# Patient Record
Sex: Male | Born: 1950
Health system: Southern US, Community
[De-identification: ages and names within clinical notes are randomized; demographics above are authoritative.]

## PROBLEM LIST (undated history)

## (undated) DIAGNOSIS — I10 Essential (primary) hypertension: Secondary | ICD-10-CM

## (undated) DIAGNOSIS — I2510 Atherosclerotic heart disease of native coronary artery without angina pectoris: Secondary | ICD-10-CM

## (undated) DIAGNOSIS — R001 Bradycardia, unspecified: Secondary | ICD-10-CM

## (undated) DIAGNOSIS — I493 Ventricular premature depolarization: Secondary | ICD-10-CM

---

## 2001-11-29 ENCOUNTER — Emergency Department (HOSPITAL_COMMUNITY): Admission: EM | Admit: 2001-11-29 | Discharge: 2001-11-29 | Payer: Self-pay | Admitting: Emergency Medicine

## 2001-12-10 ENCOUNTER — Emergency Department (HOSPITAL_COMMUNITY): Admission: EM | Admit: 2001-12-10 | Discharge: 2001-12-10 | Payer: Self-pay | Admitting: Emergency Medicine

## 2007-01-14 ENCOUNTER — Encounter: Admission: RE | Admit: 2007-01-14 | Discharge: 2007-01-14 | Payer: Self-pay | Admitting: Emergency Medicine

## 2008-12-21 IMAGING — CT CT ABDOMEN W/ CM
2 of 5 series · 17 of 46 positions shown, 19 images · IV contrast (READICAT/WATER & [ID] OMNI 300)
Comparison: none

CLINICAL DATA: Left lower quadrant pain with fever.  
 ABDOMEN CT WITH CONTRAST:
TECHNIQUE: Multidetector CT imaging of the abdomen was performed following the standard protocol during bolus administration of intravenous contrast.
 Contrast:  100 cc Omnipaque 300
 No comparison.
TECHNIQUE: Multidetector CT imaging of the pelvis was performed following the standard protocol during bolus administration of intravenous contrast.

[Series 2: abdomen w/ · axial · 0.66mm/px · z∈[-384,-29]mm · 14 of 81 slices shown, 16 images]
[im 5/81  soft-tissue]
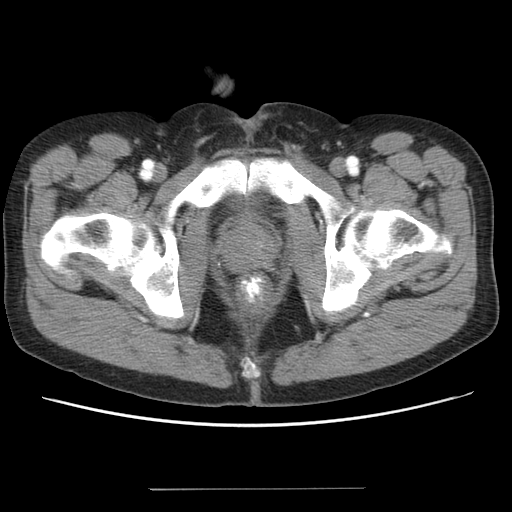
[im 5/81  bone]
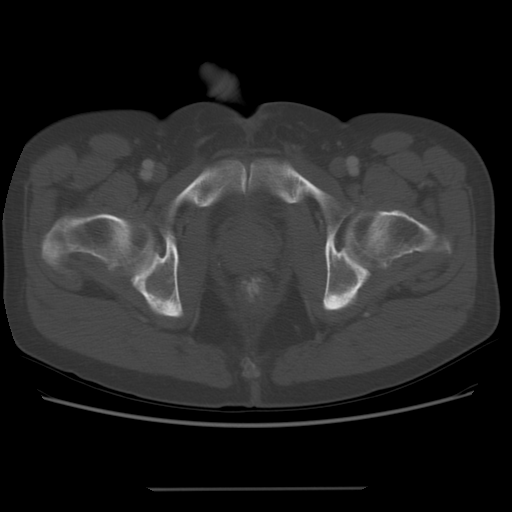
[im 9/81  soft-tissue]
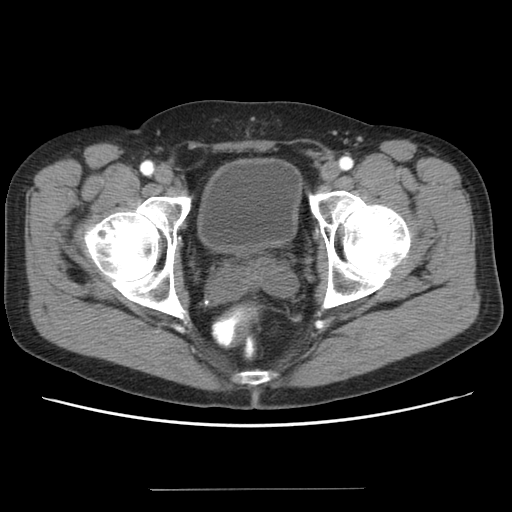
[im 18/81  soft-tissue]
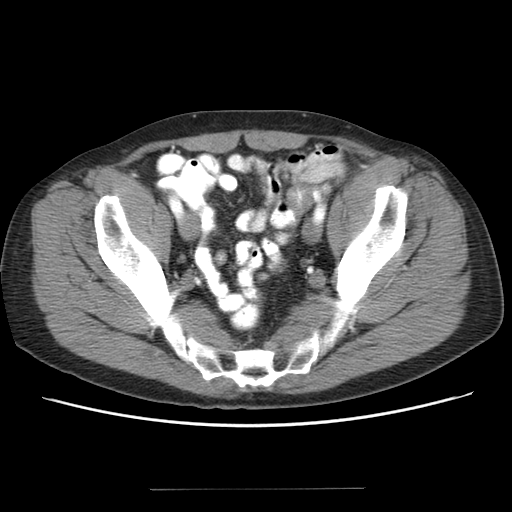
[im 23/81  soft-tissue]
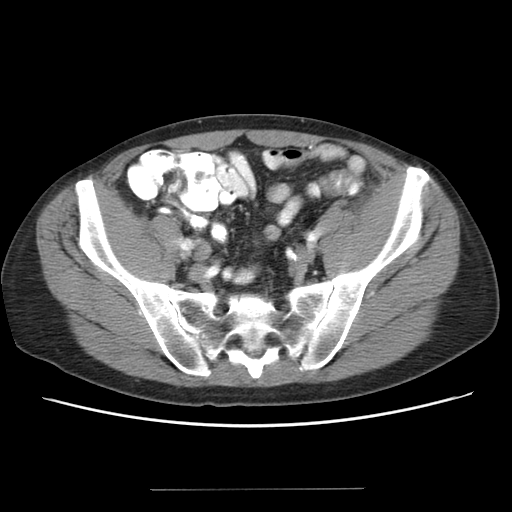
[im 27/81  soft-tissue]
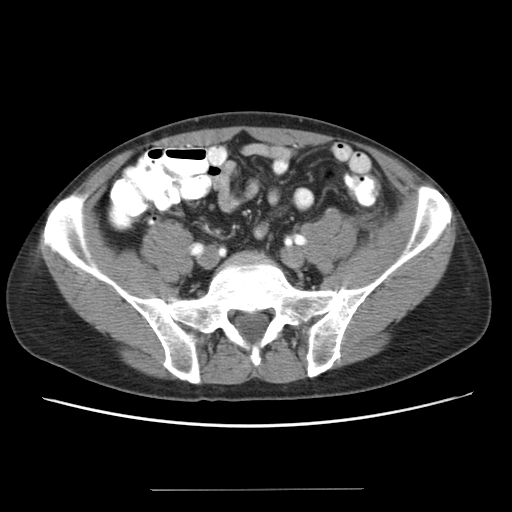
[im 32/81  soft-tissue]
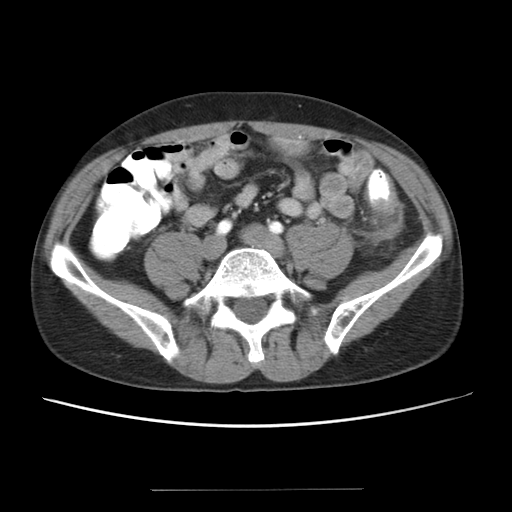
[im 36/81  soft-tissue]
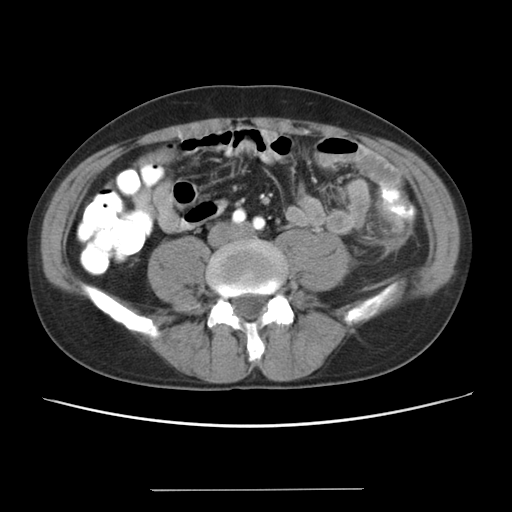
[im 45/81  soft-tissue]
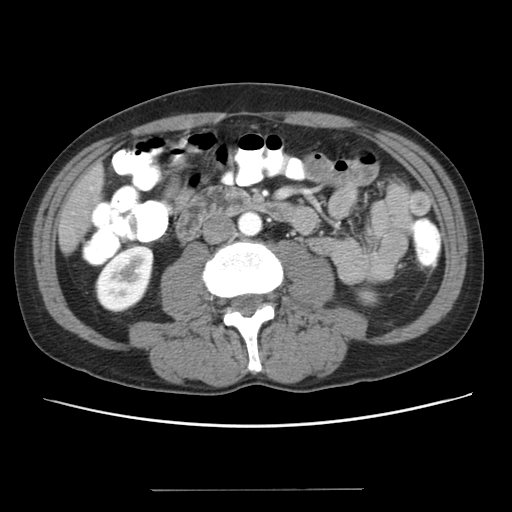
[im 49/81  soft-tissue]
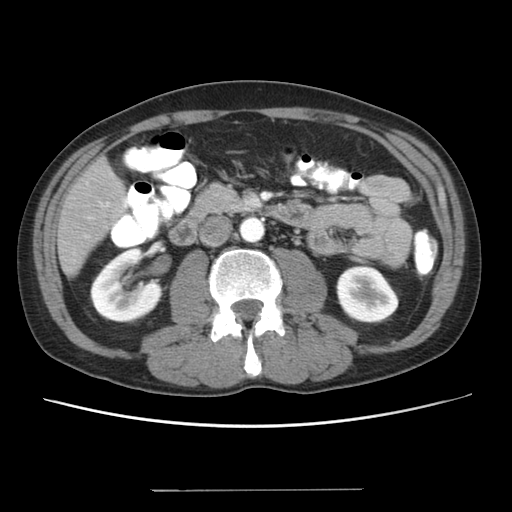
[im 49/81  bone]
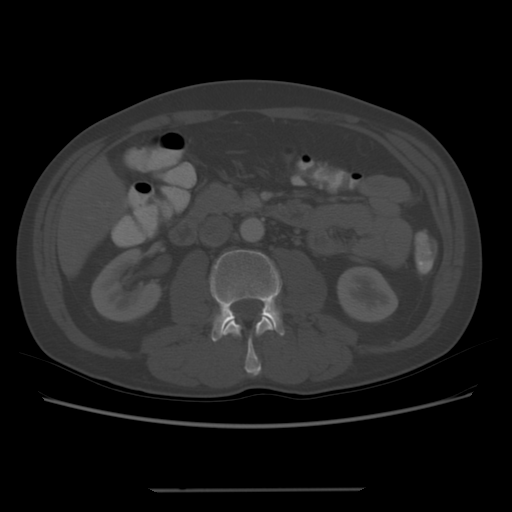
[im 54/81  soft-tissue]
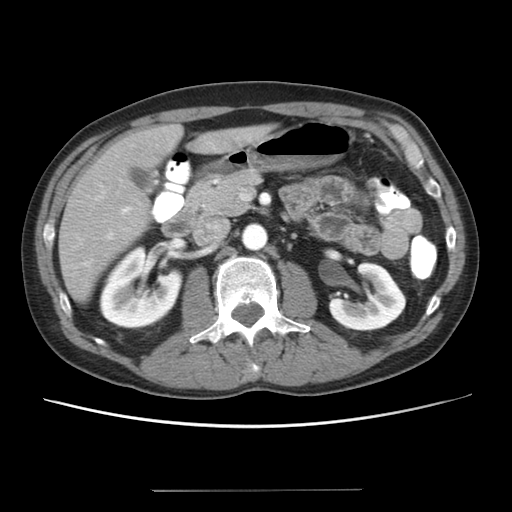
[im 58/81  soft-tissue]
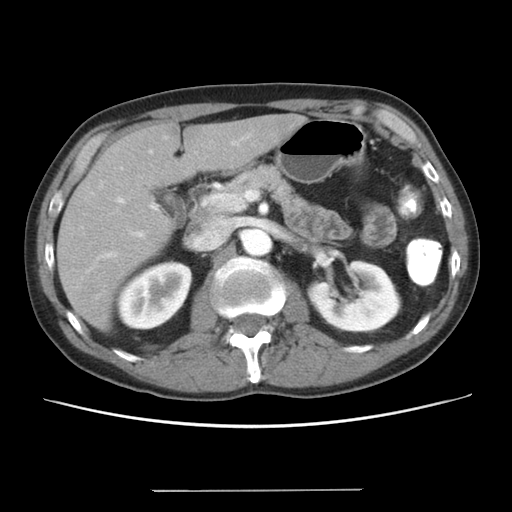
[im 63/81  soft-tissue]
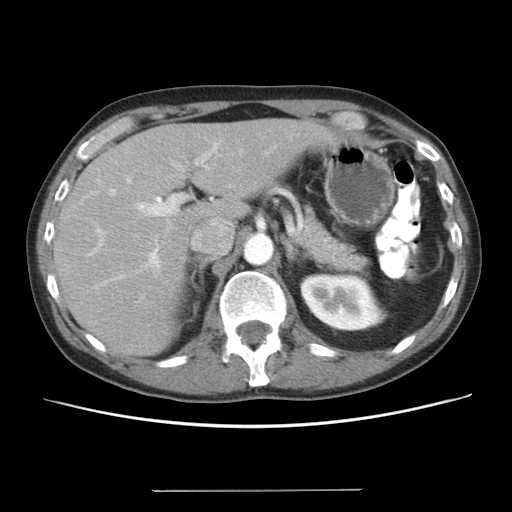
[im 72/81  soft-tissue]
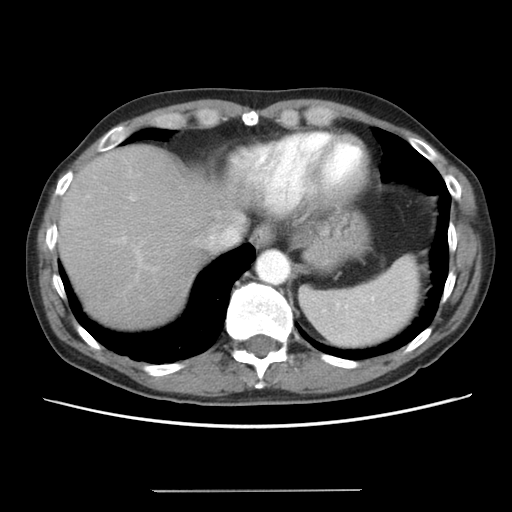
[im 76/81  soft-tissue]
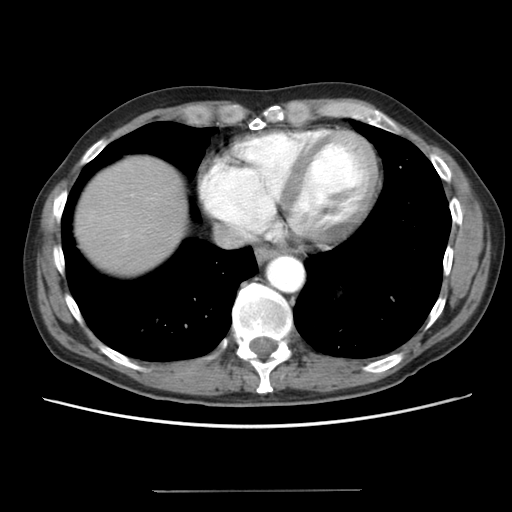

[Series 400: cor · coronal · 0.86mm/px · 3 of 88 slices shown]
[im 30/88  soft-tissue]
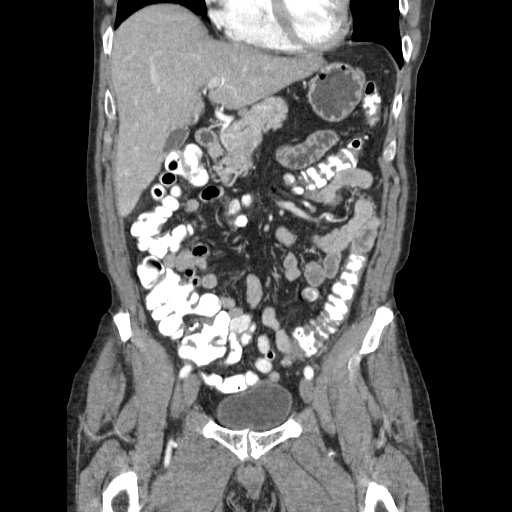
[im 39/88  soft-tissue]
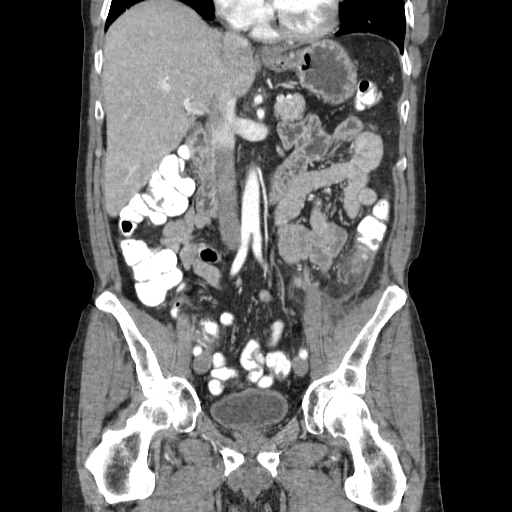
[im 49/88  soft-tissue]
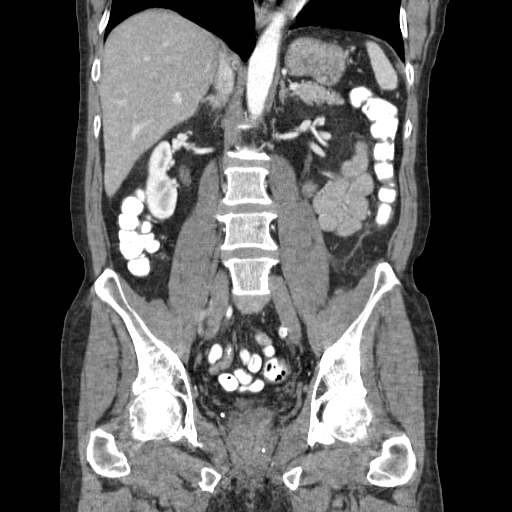

[17 of 46 positions shown; findings below may reference images not displayed]

FINDINGS: The lung bases are clear.  The liver enhances with no focal abnormality and no ductal dilatation is seen.  No calcified gallstones are noted.  The pancreas is normal in size and the pancreatic duct is not dilated.  The adrenal glands and spleen appear normal.  The kidneys enhance and on delayed images, the pelvocaliceal systems appear normal.  The abdominal aorta is normal in caliber.
IMPRESSION: Negative CT of the abdomen.
 PELVIS CT WITH CONTRAST:
FINDINGS: There is edema of the distal descending and proximal sigmoid colon with surrounding strandiness consistent with diverticulitis.  No abscess is seen.  No free air is noted.  The urinary bladder is unremarkable.  The prostate is normal in size.  The appendix appears normal as does the terminal ileum.
IMPRESSION: Diverticulitis of the distal descending and proximal sigmoid colon.  No abscess.

## 2011-03-01 ENCOUNTER — Other Ambulatory Visit: Payer: Self-pay | Admitting: Geriatric Medicine

## 2011-03-01 DIAGNOSIS — Z0000 Encounter for general adult medical examination without abnormal findings: Secondary | ICD-10-CM

## 2011-03-13 ENCOUNTER — Ambulatory Visit
Admission: RE | Admit: 2011-03-13 | Discharge: 2011-03-13 | Disposition: A | Discharge status: Home / Self Care | Payer: BC Managed Care – PPO | Source: Ambulatory Visit | Attending: Geriatric Medicine | Admitting: Geriatric Medicine

## 2011-03-13 DIAGNOSIS — Z0000 Encounter for general adult medical examination without abnormal findings: Secondary | ICD-10-CM

## 2012-01-31 ENCOUNTER — Observation Stay (HOSPITAL_COMMUNITY)
Admission: EM | Admit: 2012-01-31 | Discharge: 2012-02-01 | Disposition: A | Discharge status: Home / Self Care | Payer: BC Managed Care – PPO | Attending: Cardiology | Admitting: Cardiology

## 2012-01-31 ENCOUNTER — Encounter (HOSPITAL_COMMUNITY): Payer: Self-pay | Admitting: *Deleted

## 2012-01-31 ENCOUNTER — Emergency Department (HOSPITAL_COMMUNITY): Payer: BC Managed Care – PPO

## 2012-01-31 ENCOUNTER — Other Ambulatory Visit: Payer: Self-pay

## 2012-01-31 DIAGNOSIS — I10 Essential (primary) hypertension: Secondary | ICD-10-CM | POA: Insufficient documentation

## 2012-01-31 DIAGNOSIS — I2510 Atherosclerotic heart disease of native coronary artery without angina pectoris: Secondary | ICD-10-CM | POA: Insufficient documentation

## 2012-01-31 DIAGNOSIS — R079 Chest pain, unspecified: Secondary | ICD-10-CM

## 2012-01-31 DIAGNOSIS — R0789 Other chest pain: Principal | ICD-10-CM | POA: Insufficient documentation

## 2012-01-31 HISTORY — DX: Essential (primary) hypertension: I10

## 2012-01-31 HISTORY — DX: Bradycardia, unspecified: R00.1

## 2012-01-31 HISTORY — DX: Atherosclerotic heart disease of native coronary artery without angina pectoris: I25.10

## 2012-01-31 HISTORY — DX: Ventricular premature depolarization: I49.3

## 2012-01-31 LAB — CBC WITH DIFFERENTIAL/PLATELET
Basophils Relative: 0 % (ref 0–1)
Eosinophils Absolute: 0.3 10*3/uL (ref 0.0–0.7)
MCH: 32.1 pg (ref 26.0–34.0)
MCHC: 35.3 g/dL (ref 30.0–36.0)
Neutro Abs: 9.5 10*3/uL — ABNORMAL HIGH (ref 1.7–7.7)
Neutrophils Relative %: 68 % (ref 43–77)
Platelets: 201 10*3/uL (ref 150–400)
RBC: 4.68 MIL/uL (ref 4.22–5.81)

## 2012-01-31 LAB — BASIC METABOLIC PANEL
Chloride: 106 mEq/L (ref 96–112)
GFR calc Af Amer: >90 mL/min (ref >90)
GFR calc non Af Amer: >90 mL/min (ref >90)
Potassium: 4.1 mEq/L (ref 3.5–5.1)
Sodium: 142 mEq/L (ref 135–145)

## 2012-01-31 LAB — LIPASE, BLOOD: Lipase: 31 U/L (ref 11–59)

## 2012-01-31 LAB — POCT I-STAT TROPONIN I: Troponin i, poc: 0.09 ng/mL (ref 0.00–0.08)

## 2012-01-31 LAB — TROPONIN I: Troponin I: <0.3 ng/mL (ref <0.30)

## 2012-01-31 MED ORDER — GI COCKTAIL ~~LOC~~
30.0000 mL | Freq: Once | ORAL | Status: AC
  Administered 2012-01-31: 30 mL via ORAL
  Filled 2012-01-31: qty 30

## 2012-01-31 MED ORDER — MORPHINE SULFATE 4 MG/ML IJ SOLN
4.0000 mg | Freq: Once | INTRAMUSCULAR | Status: DC
  Filled 2012-01-31: qty 1

## 2012-01-31 MED ORDER — SODIUM CHLORIDE 0.9 % IV BOLUS (SEPSIS)
1000.0000 mL | Freq: Once | INTRAVENOUS | Status: AC
  Administered 2012-01-31: 1000 mL via INTRAVENOUS

## 2012-01-31 MED ORDER — ONDANSETRON HCL 4 MG/2ML IJ SOLN
4.0000 mg | Freq: Once | INTRAMUSCULAR | Status: DC
  Filled 2012-01-31: qty 2

## 2012-01-31 MED ORDER — ASPIRIN 81 MG PO CHEW
324.0000 mg | CHEWABLE_TABLET | Freq: Once | ORAL | Status: AC
  Administered 2012-01-31: 324 mg via ORAL
  Filled 2012-01-31: qty 4

## 2012-01-31 NOTE — ED Provider Notes (Signed)
History     CSN: [id number]  Arrival date & time 01/31/12  2030   First MD Initiated Contact with Patient 01/31/12 2057      Chief Complaint  Patient presents with  . Chest Pain    (Consider location/radiation/quality/duration/timing/severity/associated sxs/prior treatment) HPI  The patient presents with chest pain.  The sensation began in October 2 hours prior to arrival after the patient walked to a neighbors house.  He describes heaviness and pressure across the anterior chest.  The pain is severe, though he used minimally prior to arrival.  She notes no associated dyspnea, lightheadedness, syncope, nausea vomiting or diarrhea.  He states that he has been in his uterosacral. He denies any recent cardiac evaluation. The patient is generally fit, biking and running regularly.  Past Medical History  Diagnosis Date  . PVC (premature ventricular contraction)   . Hypertension     No past surgical history on file.  No family history on file.  History  Substance Use Topics  . Smoking status: Never Smoker   . Smokeless tobacco: Not on file  . Alcohol Use: Yes      Review of Systems  Constitutional:       Per HPI, otherwise negative  HENT:       Per HPI, otherwise negative  Eyes: Negative.   Respiratory:       Per HPI, otherwise negative  Cardiovascular:       Per HPI, otherwise negative  Gastrointestinal: Negative for vomiting.  Genitourinary: Negative.   Musculoskeletal:       Per HPI, otherwise negative  Skin: Negative.   Neurological: Negative for syncope.    Allergies  Review of patient's allergies indicates no known allergies.  Home Medications   Current Outpatient Rx  Name Route Sig Dispense Refill  . ATENOLOL 25 MG PO TABS Oral Take 37.5 mg by mouth daily.    . ADULT MULTIVITAMIN W/MINERALS CH Oral Take 1 tablet by mouth daily.    Marland Kitchen FISH OIL PO Oral Take 1 capsule by mouth daily.    Marland Kitchen OVER THE COUNTER MEDICATION Oral Take 1 tablet by mouth daily.  Cholesterol medicine - homeopathic    . PSYLLIUM PO Oral Take 1 capsule by mouth daily.      BP 159/78  Pulse 45  Temp 98 F (36.7 C) (Oral)  Resp 17  SpO2 98%  Physical Exam  Nursing note and vitals reviewed. Constitutional: He is oriented to person, place, and time. He appears well-developed. No distress.  HENT:  Head: Normocephalic and atraumatic.  Eyes: Conjunctivae normal and EOM are normal.  Cardiovascular: Normal rate and regular rhythm.   Pulmonary/Chest: Effort normal. No stridor. No respiratory distress.  Abdominal: He exhibits no distension.  Musculoskeletal: He exhibits no edema.  Neurological: He is alert and oriented to person, place, and time.  Skin: Skin is warm and dry.  Psychiatric: He has a normal mood and affect.    ED Course  Procedures (including critical care time)  Labs Reviewed  CBC WITH DIFFERENTIAL - Abnormal; Notable for the following:    WBC 14.1 (*)     Neutro Abs 9.5 (*)     All other components within normal limits  POCT I-STAT TROPONIN I - Abnormal; Notable for the following:    Troponin i, poc 0.09 (*)     All other components within normal limits  BASIC METABOLIC PANEL  TROPONIN I  LIPASE, BLOOD  PROTIME-INR   Dg Chest 2 View  01/31/2012  *  RADIOLOGY REPORT*  Clinical Data: Chest pressure, hypertension  CHEST - 2 VIEW  Comparison: None.  Findings: Cardiomediastinal silhouette is within normal limits. The lungs are clear. No pleural effusion.  No pneumothorax.  No acute osseous abnormality.  IMPRESSION: No acute cardiopulmonary process.   Original Report Authenticated By: Harrel Lemon, M.D.      No diagnosis found.  Cardiac 45 sinus bradycardia abnormal Pulse ox 99% room air normal    Date: 01/31/2012  Rate: 41  Rhythm: sinus bradycardia  QRS Axis: normal  Intervals: normal  ST/T Wave abnormalities: nonspecific T wave changes  Conduction Disutrbances:none  Narrative Interpretation:   Old EKG Reviewed: none  available ABNORMAL  On repeat evaluation the patient states it is feeling better MDM  This generally well male presents with chest pain.  On exam the patient is initially uncomfortable, though this is improved during his ED stay.  Given the patient's denial of any recent cardiac evaluation, the description of anterior sternal pressure following walking to a neighbors house, there suspicion of coronary ischemia, the absence of distress is a somewhat reassuring.  The patient's initial labs are notable for an elevated point-of-care troponin, though this was different than the formal troponin.  Given his description of pain, this abnormal result he was admitted for further evaluation and management.    Gerhard Munch, MD 01/31/12 2251

## 2012-01-31 NOTE — ED Notes (Signed)
Dr.Lockwood at bedside  

## 2012-01-31 NOTE — H&P (Signed)
Cardiology History and Physical  DEFAULT,PROVIDER, MD  History of Present Illness (and review of medical records): Darryl Ford is a 61 y.o. male who presents for evaluation of chest pain.  He has no hx of prior MI or known CAD.  He developed 7/10 chest pressure that felt like weight on chest around 7pm at rest.  Pressure was associated with light headedness only.  He called his PCP office on call and was told to come to ED for further evaluation.  He was given ASA, GI cocktail and NTG and is now chest pain free.  Previous diagnostic testing for coronary artery disease includes: none. Previous history of cardiac disease includes Palpitations PVCs. Coronary artery disease risk factors include: advanced age (older than 77 for men, 21 for women), hypertension and male gender. Patient denies history of CHF, coronary artery disease and valvular disease.  Review of Systems Further review of systems was otherwise negative other than stated in HPI.  Patient Active Problem List   Diagnosis Date Noted  . Chest pain 01/31/2012   Past Medical History  Diagnosis Date  . PVC (premature ventricular contraction)   . Hypertension     No past surgical history on file.   (Not in a hospital admission) No Known Allergies  History  Substance Use Topics  . Smoking status: Never Smoker   . Smokeless tobacco: Not on file  . Alcohol Use: Yes    No family history on file.   Objective: Patient Vitals for the past 8 hrs:  BP Temp Temp src Pulse Resp SpO2  01/31/12 2115 159/78 mmHg - - 45  17  98 %  01/31/12 2037 189/78 mmHg 98 F (36.7 C) Oral 41  16  100 %   General Appearance:    Alert, cooperative, no distress, appears stated age  Head:    Normocephalic, without obvious abnormality, atraumatic  Eyes:     PERRL, EOMI, anicteric sclerae  Neck:   Supple, no carotid bruit or JVD  Lungs:     Clear to auscultation bilaterally, respirations unlabored  Heart:    Regular rate and rhythm, S1 and S2  normal, no murmur  Abdomen:     Soft, non-tender, normoactive bowel sounds  Extremities:   Extremities normal, atraumatic, no cyanosis or edema  Pulses:   2+ and symmetric all extremities  Skin:   no rashes or lesions  Neurologic:   No focal deficits. AAO x3   Results for orders placed during the hospital encounter of 01/31/12 (from the past 48 hour(s))  CBC WITH DIFFERENTIAL     Status: Abnormal   Collection Time   01/31/12  9:10 PM      Component Value Range Comment   WBC 14.1 (*) 4.0 - 10.5 K/uL    RBC 4.68  4.22 - 5.81 MIL/uL    Hemoglobin 15.0  13.0 - 17.0 g/dL    HCT 16.1  09.6 - 04.5 %    MCV 90.8  78.0 - 100.0 fL    MCH 32.1  26.0 - 34.0 pg    MCHC 35.3  30.0 - 36.0 g/dL    RDW 40.9  81.1 - 91.4 %    Platelets 201  150 - 400 K/uL    Neutrophils Relative 68  43 - 77 %    Neutro Abs 9.5 (*) 1.7 - 7.7 K/uL    Lymphocytes Relative 23  12 - 46 %    Lymphs Abs 3.2  0.7 - 4.0 K/uL  Monocytes Relative 7  3 - 12 %    Monocytes Absolute 1.0  0.1 - 1.0 K/uL    Eosinophils Relative 2  0 - 5 %    Eosinophils Absolute 0.3  0.0 - 0.7 K/uL    Basophils Relative 0  0 - 1 %    Basophils Absolute 0.0  0.0 - 0.1 K/uL   BASIC METABOLIC PANEL     Status: Normal   Collection Time   01/31/12  9:10 PM      Component Value Range Comment   Sodium 142  135 - 145 mEq/L    Potassium 4.1  3.5 - 5.1 mEq/L    Chloride 106  96 - 112 mEq/L    CO2 23  19 - 32 mEq/L    Glucose, Bld 94  70 - 99 mg/dL    BUN 11  6 - 23 mg/dL    Creatinine, Ser 4.09  0.50 - 1.35 mg/dL    Calcium 81.1  8.4 - 10.5 mg/dL    GFR calc non Af Amer >90  >90 mL/min    GFR calc Af Amer >90  >90 mL/min   POCT I-STAT TROPONIN I     Status: Abnormal   Collection Time   01/31/12  9:24 PM      Component Value Range Comment   Troponin i, poc 0.09 (*) 0.00 - 0.08 ng/mL    Comment NOTIFIED PHYSICIAN      Comment 3            TROPONIN I     Status: Normal   Collection Time   01/31/12  9:37 PM      Component Value Range  Comment   Troponin I <0.30  <0.30 ng/mL   LIPASE, BLOOD     Status: Normal   Collection Time   01/31/12  9:37 PM      Component Value Range Comment   Lipase 31  11 - 59 U/L   PROTIME-INR     Status: Normal   Collection Time   01/31/12  9:37 PM      Component Value Range Comment   Prothrombin Time 12.1  11.6 - 15.2 seconds    INR 0.90  0.00 - 1.49    Dg Chest 2 View  01/31/2012  *RADIOLOGY REPORT*  Clinical Data: Chest pressure, hypertension  CHEST - 2 VIEW  Comparison: None.  Findings: Cardiomediastinal silhouette is within normal limits. The lungs are clear. No pleural effusion.  No pneumothorax.  No acute osseous abnormality.  IMPRESSION: No acute cardiopulmonary process.   Original Report Authenticated By: Harrel Lemon, M.D.     ECG:  Sinus brady with sinus arrythmia, no prior to compare, no acute ischemic changes  Assessment: 16M hx of HTN presents with chest pain at rest, with no acute changes on ecg and initally negative biomarkers.  Plan:  1. Admit to Cardiology 2. Continuous monitoring on Telemetry. 3. Repeat ekg on admit, prn chest pain or arrythmia 4. Trend cardiac biomarkers, check lipids, hgba1c, tsh 5. Medical management to include ASA, NTG prn 6. Likely plan for further ischemic evaluation in am with stress test vs cardiac catheterization.

## 2012-01-31 NOTE — ED Notes (Signed)
Pt to ED c/o acute onset chest heaviness this evening.  He states he does have hx of gerd, but has eaten nothing since lunch.  Pt states the pressure has decreased since arriving.  No sob, diaphoresis noted.

## 2012-01-31 NOTE — ED Notes (Signed)
Patient with acute onset of centralized chest heaviness that started about two hours ago.  Patient states that the pain is heaviness in nature, did experience light headedness and his stomach does not feel good.  Patient states that his back feels sore but not sure if it is associated with his chest.

## 2012-01-31 NOTE — ED Notes (Signed)
Patient transported to X-ray 

## 2012-02-01 ENCOUNTER — Encounter (HOSPITAL_COMMUNITY)
Admission: EM | Disposition: A | Discharge status: Home / Self Care | Payer: Self-pay | Source: Home / Self Care | Attending: Emergency Medicine

## 2012-02-01 ENCOUNTER — Encounter (HOSPITAL_COMMUNITY): Payer: Self-pay | Admitting: *Deleted

## 2012-02-01 DIAGNOSIS — I2510 Atherosclerotic heart disease of native coronary artery without angina pectoris: Secondary | ICD-10-CM

## 2012-02-01 HISTORY — PX: LEFT HEART CATHETERIZATION WITH CORONARY ANGIOGRAM: SHX5451

## 2012-02-01 HISTORY — PX: LEFT HEART CATH: SHX5946

## 2012-02-01 LAB — CBC
HCT: 37.6 % — ABNORMAL LOW (ref 39.0–52.0)
Hemoglobin: 13.1 g/dL (ref 13.0–17.0)
MCH: 31.9 pg (ref 26.0–34.0)
MCHC: 34.8 g/dL (ref 30.0–36.0)
MCV: 91.5 fL (ref 78.0–100.0)

## 2012-02-01 LAB — BASIC METABOLIC PANEL
BUN: 11 mg/dL (ref 6–23)
Calcium: 9.3 mg/dL (ref 8.4–10.5)
GFR calc non Af Amer: 87 mL/min — ABNORMAL LOW (ref >90)
Glucose, Bld: 96 mg/dL (ref 70–99)

## 2012-02-01 LAB — TROPONIN I: Troponin I: <0.3 ng/mL (ref <0.30)

## 2012-02-01 LAB — HEMOGLOBIN A1C: Mean Plasma Glucose: 105 mg/dL (ref <117)

## 2012-02-01 LAB — LIPID PANEL
Cholesterol: 189 mg/dL (ref 0–200)
HDL: 75 mg/dL (ref >39)
Total CHOL/HDL Ratio: 2.5 RATIO

## 2012-02-01 SURGERY — LEFT HEART CATHETERIZATION WITH CORONARY ANGIOGRAM
Anesthesia: LOCAL

## 2012-02-01 MED ORDER — NITROGLYCERIN 0.4 MG SL SUBL: 0.4000 mg | SUBLINGUAL_TABLET | SUBLINGUAL | Status: DC | PRN

## 2012-02-01 MED ORDER — ADULT MULTIVITAMIN W/MINERALS CH
1.0000 | ORAL_TABLET | Freq: Every day | ORAL | Status: DC
  Filled 2012-02-01: qty 1

## 2012-02-01 MED ORDER — SODIUM CHLORIDE 0.9 % IV SOLN
INTRAVENOUS | Status: AC
  Administered 2012-02-01: 02:00:00 via INTRAVENOUS

## 2012-02-01 MED ORDER — ASPIRIN EC 81 MG PO TBEC
81.0000 mg | DELAYED_RELEASE_TABLET | Freq: Every day | ORAL | Status: DC
  Filled 2012-02-01: qty 1

## 2012-02-01 MED ORDER — ASPIRIN 81 MG PO TBEC: 81.0000 mg | DELAYED_RELEASE_TABLET | Freq: Every day | ORAL | Status: AC

## 2012-02-01 MED ORDER — HEPARIN SODIUM (PORCINE) 1000 UNIT/ML IJ SOLN
INTRAMUSCULAR | Status: AC
  Filled 2012-02-01: qty 1

## 2012-02-01 MED ORDER — LIDOCAINE HCL (PF) 1 % IJ SOLN
INTRAMUSCULAR | Status: AC
  Filled 2012-02-01: qty 30

## 2012-02-01 MED ORDER — OMEGA-3-ACID ETHYL ESTERS 1 G PO CAPS
1.0000 g | ORAL_CAPSULE | Freq: Every day | ORAL | Status: DC
  Filled 2012-02-01: qty 1

## 2012-02-01 MED ORDER — NITROGLYCERIN 0.2 MG/ML ON CALL CATH LAB
INTRAVENOUS | Status: AC
  Filled 2012-02-01: qty 1

## 2012-02-01 MED ORDER — MIDAZOLAM HCL 2 MG/2ML IJ SOLN
INTRAMUSCULAR | Status: AC
  Filled 2012-02-01: qty 2

## 2012-02-01 MED ORDER — ACETAMINOPHEN 325 MG PO TABS: 650.0000 mg | ORAL_TABLET | ORAL | Status: DC | PRN

## 2012-02-01 MED ORDER — VERAPAMIL HCL 2.5 MG/ML IV SOLN
INTRAVENOUS | Status: AC
  Filled 2012-02-01: qty 2

## 2012-02-01 MED ORDER — SODIUM CHLORIDE 0.9 % IJ SOLN: 3.0000 mL | INTRAMUSCULAR | Status: DC | PRN

## 2012-02-01 MED ORDER — ATORVASTATIN CALCIUM 10 MG PO TABS: 10.0000 mg | ORAL_TABLET | Freq: Every day | ORAL | Status: DC

## 2012-02-01 MED ORDER — LISINOPRIL 10 MG PO TABS: 10.0000 mg | ORAL_TABLET | Freq: Every day | ORAL | Status: DC

## 2012-02-01 MED ORDER — ONDANSETRON HCL 4 MG/2ML IJ SOLN: 4.0000 mg | Freq: Four times a day (QID) | INTRAMUSCULAR | Status: DC | PRN

## 2012-02-01 MED ORDER — DIAZEPAM 5 MG PO TABS
5.0000 mg | ORAL_TABLET | ORAL | Status: AC
  Administered 2012-02-01: 5 mg via ORAL
  Filled 2012-02-01: qty 1

## 2012-02-01 MED ORDER — HEPARIN (PORCINE) IN NACL 2-0.9 UNIT/ML-% IJ SOLN
INTRAMUSCULAR | Status: AC
  Filled 2012-02-01: qty 1000

## 2012-02-01 MED ORDER — SODIUM CHLORIDE 0.9 % IJ SOLN: 3.0000 mL | Freq: Two times a day (BID) | INTRAMUSCULAR | Status: DC

## 2012-02-01 MED ORDER — SODIUM CHLORIDE 0.9 % IV SOLN: INTRAVENOUS | Status: DC

## 2012-02-01 MED ORDER — FENTANYL CITRATE 0.05 MG/ML IJ SOLN
INTRAMUSCULAR | Status: AC
  Filled 2012-02-01: qty 2

## 2012-02-01 MED ORDER — ASPIRIN 81 MG PO CHEW: 324.0000 mg | CHEWABLE_TABLET | ORAL | Status: DC

## 2012-02-01 MED ORDER — SODIUM CHLORIDE 0.9 % IV SOLN: INTRAVENOUS | Status: AC

## 2012-02-01 MED ORDER — SODIUM CHLORIDE 0.9 % IV SOLN: 250.0000 mL | INTRAVENOUS | Status: DC | PRN

## 2012-02-01 NOTE — Interval H&P Note (Signed)
History and Physical Interval Note:  02/01/2012 8:57 AM  Darryl Ford  has presented today for surgery, with the diagnosis of chest pain  The various methods of treatment have been discussed with the patient and family. After consideration of risks, benefits and other options for treatment, the patient has consented to  Procedure(s) (LRB) with comments: LEFT HEART CATHETERIZATION WITH CORONARY ANGIOGRAM (N/A) as a surgical intervention .  The patient's history has been reviewed, patient examined, no change in status, stable for surgery.  I have reviewed the patient's chart and labs.  Questions were answered to the patient's satisfaction.     Blannie Shedlock [employer]

## 2012-02-01 NOTE — Discharge Summary (Signed)
Discharge Summary   Patient ID: Darryl Ford MRN: [id number], DOB/AGE: 06-03-1950 61 y.o. Admit date: 01/31/2012 D/C date:     02/01/2012  Primary Cardiologist: Shirlee Latch  Primary Discharge Diagnoses:  1. Chest pain, felt noncardiac 2. 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 HTN 4. Sinus bradycardia - TSH WNL - BB discontinued  Secondary Discharge Diagnoses:  1. Hx PVCs  Hospital Course: 61 y/o M with no prior hx of MI or known CAD presented to [facility] with CP. He developed 7/10 chest pressure that felt like weight on chest around 7pm at rest. The pressure was associated with lightheadedness. He called his PCP office on call and was told to come to ED for further evaluation. He was given ASA, GI cocktail and NTG and was chest pain free upon evaluation by cardiology. EKG showed sinus bradycardia HR 41 with sinus arrythmia, no prior to compare, no acute ischemic changes. He was hypertensive on admission at 189/78 which the patient felt was unusual for him. Atenolol was held due to bradycardia. (Note: The patient reports that over the past month or so, his HR has been in the 40's as an outpatient. He had discussed this with his PCP and since he was asymptomatic and the beta blocker was helping his PVCs, atenolol was continued at that time. He reports that the PVCs have really not been an issue for him for a while.) He was admitted for rule-out. His BP came down overnight to the 120-150 range systolic. Initial POC troponin was mildly elevated at 0.09 but next 2 sets were completely negative. WBC was 14.1 but normal today. TSH WNL. CXR showed no acute CP process. He was afebrile. He was not tachycardic, tachypnic or hypoxic. He underwent cath demonstrating LM 20%, 30% prox LAD, 60% ostial D1, mild luminal irregularities of RCA, EF 60-65%. His CP was felt noncardiac by Dr. Shirlee Latch although it is unclear if his bradycardia was contributing any. He did  have very transient HR down into the 30's and 40's this morning but was completely asymptomatic. Per discussion with Dr. Shirlee Latch, atenolol will be discontinued and lisinopril 10mg  will be tentatively initiated for his blood pressure. The patient feels that his blood pressure does not usually run quite so high, so he plans on checking it at home and if it runs over 130/80, he will initiate lisinopril which is reasonable. The patient was also started on ASA 81mg  and atorvastatin 10mg  daily for risk reduction. Dr. Shirlee Latch has seen and examined him and feels he is stable for discharge. HR was in the 50s at discharge and the patient feels well.  Due to initiation of lisinopril, will also schedule tentative BMET at time of his follow-up appointment. He may also need repeat LFTs/lipids in several weeks given new initiation of statin.  Discharge Vitals: Blood pressure 137/64, pulse 53, temperature 98.4 F (36.9 C), temperature source Oral, resp. rate 18, height 5\' 9"  (1.753 m), weight 146 lb (66.225 kg), SpO2 99.00%.  Labs: Lab Results  Component Value Date   WBC 9.5 02/01/2012   HGB 13.1 02/01/2012   HCT 37.6* 02/01/2012   MCV 91.5 02/01/2012   PLT 182 02/01/2012     Lab 02/01/12 0326  NA 141  K 4.5  CL 107  CO2 26  BUN 11  CREATININE 0.95  CALCIUM 9.3  PROT --  BILITOT --  ALKPHOS --  ALT --  AST --  GLUCOSE 96  Basename 02/01/12 0326 01/31/12 2137  CKTOTAL -- --  CKMB -- --  TROPONINI <0.30 <0.30   Lab Results  Component Value Date   CHOL 189 02/01/2012   HDL 75 02/01/2012   LDLCALC 94 02/01/2012   TRIG 101 02/01/2012    Diagnostic Studies/Procedures   1. Dg Chest 2 View 01/31/2012  *RADIOLOGY REPORT*  Clinical Data: Chest pressure, hypertension  CHEST - 2 VIEW  Comparison: None.  Findings: Cardiomediastinal silhouette is within normal limits. The lungs are clear. No pleural effusion.  No pneumothorax.  No acute osseous abnormality.  IMPRESSION: No acute cardiopulmonary  process.   Original Report Authenticated By: Harrel Lemon, M.D.    2. Cath 02/01/12 Procedural Findings:  Hemodynamics:  AO 98/50  LV 114/6  Coronary angiography:  Coronary dominance: right  Left mainstem: 20% ostial tapering.  Left anterior descending (LAD): 30% proximal LAD stenosis. 60% ostial D1. Remainder of LAD with mild luminal irregularities.  Left circumflex (LCx): Small LCx system with minimal disease.  Right coronary artery (RCA): Mild luminal irregularities.  Left ventriculography: Left ventricular systolic function is normal, LVEF is estimated at 60-65%, there is no significant mitral regurgitation. No wall motion abnormalities in RAO projection.  Final Conclusions: Nonobstructive coronary disease. Suspect chest pain was noncardiac.  Recommendations: May be discharged today. Would start ASA 81 mg daily and atorvastatin 10 mg daily.    Discharge Medications   Current Discharge Medication List    START taking these medications   Details  aspirin EC 81 MG EC tablet Take 1 tablet (81 mg total) by mouth daily.    atorvastatin (LIPITOR) 10 MG tablet Take 1 tablet (10 mg total) by mouth at bedtime. Qty: 30 tablet, Refills: 6    lisinopril (PRINIVIL) 10 MG tablet Take 1 tablet (10 mg total) by mouth daily. Qty: 30 tablet, Refills: 6    nitroGLYCERIN (NITROSTAT) 0.4 MG SL tablet Place 1 tablet (0.4 mg total) under the tongue every 5 (five) minutes x 3 doses as needed for chest pain. Qty: 25 tablet, Refills: 4      CONTINUE these medications which have NOT CHANGED   Details  Multiple Vitamin (MULTIVITAMIN WITH MINERALS) TABS Take 1 tablet by mouth daily.    Omega-3 Fatty Acids (FISH OIL PO) Take 1 capsule by mouth daily.    PSYLLIUM PO Take 1 capsule by mouth daily.      STOP taking these medications     atenolol (TENORMIN) 25 MG tablet Comments:  Reason for Stopping:       OVER THE COUNTER MEDICATION Comments:  Reason for Stopping:          Disposition    The patient will be discharged in stable condition to home. Discharge Orders    Future Appointments: Provider: Department: Dept Phone: Center:   02/08/2012 9:25 AM [facility] [phone] [facility]   02/08/2012 9:50 AM Beatrice Lecher, PA [facility] [phone] [facility]     Future Orders Please Complete By Expires   Diet - low sodium heart healthy      Increase activity slowly      Comments:   - No driving for 2 days. No lifting over 5 lbs for 1 week. No sexual activity for 1 week. Keep procedure site clean & dry. If you notice increased pain, swelling, bleeding or pus, call/return!  You may shower, but no soaking baths/hot tubs/pools for 1 week. If you have recurrent chest pain, shortness of breath, dizziness, passing out  or any other symptoms concerning to you, please seek medical care.  - Check your blood pressure daily at home at different times of the day this week. If it runs greater than 130 on the top number or 80 on the bottom number, go ahead and start your lisinopril.     Follow-up Information    Follow up with Tereso Newcomer, PA. (Friday 02/08/12. Please arrive at 9:30am - you will have labwork first, then your appointment will be at 9:50am.)    Contact information:   [facility] [address] [place] [address] [phone]            Duration of Discharge Encounter: Greater than 30 minutes including physician and PA time.  Signed, Ronie Spies PA-C 02/01/2012, 12:54 PM

## 2012-02-01 NOTE — H&P (View-Only) (Signed)
    SUBJECTIVE: Severe chest pain last night. No chest pain this am. No SOB this am.   BP 119/58  Pulse 41  Temp 98.2 F (36.8 C) (Oral)  Resp 18  Ht 5' 9" (1.753 m)  Wt 146 lb (66.225 kg)  BMI 21.56 kg/m2  SpO2 98%  Intake/Output Summary (Last 24 hours) at 02/01/12 0656 Last data filed at 02/01/12 0632  Gross per 24 hour  Intake 371.25 ml  Output      0 ml  Net 371.25 ml    PHYSICAL EXAM General: Well developed, well nourished, in no acute distress. Alert and oriented x 3.  Psych:  Good affect, responds appropriately Neck: No JVD. No masses noted.  Lungs: Clear bilaterally with no wheezes or rhonci noted.  Heart: Brady with no murmurs noted. Abdomen: Bowel sounds are present. Soft, non-tender.  Extremities: No lower extremity edema.   LABS: Basic Metabolic Panel:  Basename 02/01/12 0326 01/31/12 2110  NA 141 142  K 4.5 4.1  CL 107 106  CO2 26 23  GLUCOSE 96 94  BUN 11 11  CREATININE 0.95 0.85  CALCIUM 9.3 10.1  MG -- --  PHOS -- --   CBC:  Basename 02/01/12 0326 01/31/12 2110  WBC 9.5 14.1*  NEUTROABS -- 9.5*  HGB 13.1 15.0  HCT 37.6* 42.5  MCV 91.5 90.8  PLT 182 201   Cardiac Enzymes:  Basename 02/01/12 0326 01/31/12 2137  CKTOTAL -- --  CKMB -- --  CKMBINDEX -- --  TROPONINI <0.30 <0.30   Fasting Lipid Panel:  Basename 02/01/12 0326  CHOL 189  HDL 75  LDLCALC 94  TRIG 101  CHOLHDL 2.5  LDLDIRECT --    Current Meds:    . aspirin  324 mg Oral Once  . aspirin EC  81 mg Oral Daily  . gi cocktail  30 mL Oral Once  .  morphine injection  4 mg Intravenous Once  . multivitamin with minerals  1 tablet Oral Daily  . omega-3 acid ethyl esters  1 g Oral Daily  . ondansetron (ZOFRAN) IV  4 mg Intravenous Once  . sodium chloride  1,000 mL Intravenous Once     ASSESSMENT AND PLAN:  1. Chest pain/Unstable angina: One episode of severe chest pressure prior to admission relieved with NTG and GI cocktail. No chest pain at rest this am.  Cardiac enzymes are negative. EKG with bradycardia but no ST or T wave changes suggestive of ischemia. He has a history of former tobacco use, borderline elevated cholesterol, HTN with recent elevation on therapy and strong family history of CAD with his father having multi-vessel CABG at age 62 (not a smoker). He is very active but has had recent fatigue. We have discussed options including stress testing vs cardiac cath. He is very anxious about the possibility of CAD given his family history, age and risk factors for CAD. We will pursue cath today with possible PCI. Risks and benefits have been reviewed with the patient. He agrees to proceed. Pre-cath labs are ok.   Elody Kleinsasser  10/11/20136:56 AM  

## 2012-02-01 NOTE — Procedures (Signed)
**Note Darryl-Identified via Obfuscation**    Cardiac Catheterization Procedure Note  Name: DEMARR Ford MRN: [id number] DOB: September 23, 1950  Procedure: Left Heart Cath, Selective Coronary Angiography, LV angiography  Indication: Chest pain worrisome for unstable angina.    Procedural Details: The right wrist was prepped, draped, and anesthetized with 1% lidocaine. Using the modified Seldinger technique, a 5 French sheath was introduced into the right radial artery. 3 mg of verapamil was administered through the sheath, weight-based unfractionated heparin was administered intravenously. Standard Judkins catheters were used for selective coronary angiography and left ventriculography. Catheter exchanges were performed over an exchange length guidewire. There were no immediate procedural complications. A TR band was used for radial hemostasis at the completion of the procedure.  The patient was transferred to the post catheterization recovery area for further monitoring.  Procedural Findings: Hemodynamics: AO 98/50 LV 114/6  Coronary angiography: Coronary dominance: right  Left mainstem: 20% ostial tapering.   Left anterior descending (LAD): 30% proximal LAD stenosis.  60% ostial D1. Remainder of LAD with mild luminal irregularities.   Left circumflex (LCx): Small LCx system with minimal disease.   Right coronary artery (RCA): Mild luminal irregularities.   Left ventriculography: Left ventricular systolic function is normal, LVEF is estimated at 60-65%, there is no significant mitral regurgitation.  No wall motion abnormalities in RAO projection.   Final Conclusions:  Nonobstructive coronary disease.  Suspect chest pain was noncardiac.   Recommendations: May be discharged today.  Would start ASA 81 mg daily and atorvastatin 10 mg daily.   Marca Ancona 02/01/2012, 9:33 AM

## 2012-02-01 NOTE — Progress Notes (Signed)
    SUBJECTIVE: Severe chest pain last night. No chest pain this am. No SOB this am.   BP 119/58  Pulse 41  Temp 98.2 F (36.8 C) (Oral)  Resp 18  Ht 5\' 9"  (1.753 m)  Wt 146 lb (66.225 kg)  BMI 21.56 kg/m2  SpO2 98%  Intake/Output Summary (Last 24 hours) at 02/01/12 0656 Last data filed at 02/01/12 9528  Gross per 24 hour  Intake 371.25 ml  Output      0 ml  Net 371.25 ml    PHYSICAL EXAM General: Well developed, well nourished, in no acute distress. Alert and oriented x 3.  Psych:  Good affect, responds appropriately Neck: No JVD. No masses noted.  Lungs: Clear bilaterally with no wheezes or rhonci noted.  Heart: Huston Foley with no murmurs noted. Abdomen: Bowel sounds are present. Soft, non-tender.  Extremities: No lower extremity edema.   LABS: Basic Metabolic Panel:  Basename 02/01/12 0326 01/31/12 2110  NA 141 142  K 4.5 4.1  CL 107 106  CO2 26 23  GLUCOSE 96 94  BUN 11 11  CREATININE 0.95 0.85  CALCIUM 9.3 10.1  MG -- --  PHOS -- --   CBC:  Basename 02/01/12 0326 01/31/12 2110  WBC 9.5 14.1*  NEUTROABS -- 9.5*  HGB 13.1 15.0  HCT 37.6* 42.5  MCV 91.5 90.8  PLT 182 201   Cardiac Enzymes:  Basename 02/01/12 0326 01/31/12 2137  CKTOTAL -- --  CKMB -- --  CKMBINDEX -- --  TROPONINI <0.30 <0.30   Fasting Lipid Panel:  Basename 02/01/12 0326  CHOL 189  HDL 75  LDLCALC 94  TRIG 101  CHOLHDL 2.5  LDLDIRECT --    Current Meds:    . aspirin  324 mg Oral Once  . aspirin EC  81 mg Oral Daily  . gi cocktail  30 mL Oral Once  .  morphine injection  4 mg Intravenous Once  . multivitamin with minerals  1 tablet Oral Daily  . omega-3 acid ethyl esters  1 g Oral Daily  . ondansetron (ZOFRAN) IV  4 mg Intravenous Once  . sodium chloride  1,000 mL Intravenous Once     ASSESSMENT AND PLAN:  1. Chest pain/Unstable angina: One episode of severe chest pressure prior to admission relieved with NTG and GI cocktail. No chest pain at rest this am.  Cardiac enzymes are negative. EKG with bradycardia but no ST or T wave changes suggestive of ischemia. He has a history of former tobacco use, borderline elevated cholesterol, HTN with recent elevation on therapy and strong family history of CAD with his father having multi-vessel CABG at age 67 (not a smoker). He is very active but has had recent fatigue. We have discussed options including stress testing vs cardiac cath. He is very anxious about the possibility of CAD given his family history, age and risk factors for CAD. We will pursue cath today with possible PCI. Risks and benefits have been reviewed with the patient. He agrees to proceed. Pre-cath labs are ok.   Darryl Ford  10/11/20136:56 AM

## 2012-02-01 NOTE — Progress Notes (Signed)
Utilization Review Completed.Bertina Guthridge T10/02/2012   

## 2012-02-08 ENCOUNTER — Other Ambulatory Visit (INDEPENDENT_AMBULATORY_CARE_PROVIDER_SITE_OTHER): Payer: BC Managed Care – PPO

## 2012-02-08 ENCOUNTER — Ambulatory Visit (INDEPENDENT_AMBULATORY_CARE_PROVIDER_SITE_OTHER): Payer: BC Managed Care – PPO | Admitting: Physician Assistant

## 2012-02-08 ENCOUNTER — Encounter: Payer: Self-pay | Admitting: Physician Assistant

## 2012-02-08 ENCOUNTER — Telehealth: Payer: Self-pay | Admitting: *Deleted

## 2012-02-08 VITALS — BP 132/68 | HR 56 | Resp 18 | Ht 69.0 in | Wt 148.4 lb

## 2012-02-08 DIAGNOSIS — E785 Hyperlipidemia, unspecified: Secondary | ICD-10-CM | POA: Insufficient documentation

## 2012-02-08 DIAGNOSIS — R0989 Other specified symptoms and signs involving the circulatory and respiratory systems: Secondary | ICD-10-CM

## 2012-02-08 DIAGNOSIS — I10 Essential (primary) hypertension: Secondary | ICD-10-CM

## 2012-02-08 DIAGNOSIS — I2510 Atherosclerotic heart disease of native coronary artery without angina pectoris: Secondary | ICD-10-CM | POA: Insufficient documentation

## 2012-02-08 LAB — BASIC METABOLIC PANEL
Calcium: 9.7 mg/dL (ref 8.4–10.5)
GFR: 102.28 mL/min (ref >60.00)
Sodium: 137 mEq/L (ref 135–145)

## 2012-02-08 NOTE — Patient Instructions (Addendum)
Your physician recommends that you return for lab work in: TODAY BMET   PLEASE HAVE PCP GET FASTING LIPID AND LIVER PANEL WITH YOUR VISIT; PLEASE HAVE RESULTS FAXED TO SCOTT WEAVER, PAC [phone]  BLOOD PRESSURE CHECK 02/22/12 @ 9 AM; BRING CUFF FROM HOME  Your physician wants you to follow-up in: 6 MONTHS WITH DR. Shirlee Latch. You will receive a reminder letter in the mail two months in advance. If you don't receive a letter, please call our office to schedule the follow-up appointment.

## 2012-02-08 NOTE — Telephone Encounter (Signed)
pt aware of lab results, verbalized understanding today. explained MY Chart to pt as well so he can sign up

## 2012-02-08 NOTE — Progress Notes (Signed)
[address], [address] [place], [place]  [address] Phone: [phone], Fax:  [phone]  Date:  02/08/2012   Name:  Darryl Ford   DOB:  1951-01-27   MRN:  [id number]  PCP:  Sheila Oats, MD  Primary Cardiologist:  Dr. Marca Ancona    History of Present Illness: Darryl Ford is a 61 y.o. male who returns for follow up after recent hospitalization.  Has a history of hypertension and PVCs. He was admitted 10/10-10/11 with chest pain. He was also noted to be bradycardic. His atenolol was discontinued. He ruled out for myocardial infarction by enzymes. Blood pressures were significantly elevated. Lisinopril was added. LHC 02/01/12 demonstrated nonobstructive disease with normal LV function. Risk factor modification was recommended. Atorvastatin and aspirin was added to his medical regimen. Since discharge, he is doing well. He denies chest pain, shortness of breath, syncope, near syncope, lightheadedness. He went running yesterday for 3-1/2 miles. He did well without any limitations.  Labs (10/13):   K 4.5, creatinine 0.95, LDL 94, Hgb 13.1 A1c 5.3, TSH 3.230   Past Medical History  Diagnosis Date  . PVC (premature ventricular contraction)   . Hypertension     Abdominal Ultrasound 11/12:   No AAA  . CAD (coronary artery disease)     a. Cath 02/01/12 - LM 20%, 30% prox LAD, 60% ostial D1, mild luminal irregularities of RCA, EF 60-65%.  . Sinus bradycardia     Current Outpatient Prescriptions  Medication Sig Dispense Refill  . aspirin EC 81 MG EC tablet Take 1 tablet (81 mg total) by mouth daily.      Marland Kitchen atorvastatin (LIPITOR) 10 MG tablet Take 1 tablet (10 mg total) by mouth at bedtime.  30 tablet  6  . lisinopril (PRINIVIL) 10 MG tablet Take 1 tablet (10 mg total) by mouth daily.  30 tablet  6  . Multiple Vitamin (MULTIVITAMIN WITH MINERALS) TABS Take 1 tablet by mouth daily.      . nitroGLYCERIN (NITROSTAT) 0.4 MG SL tablet Place 1 tablet (0.4 mg total) under  the tongue every 5 (five) minutes x 3 doses as needed for chest pain.  25 tablet  4  . Omega-3 Fatty Acids (FISH OIL PO) Take 1 capsule by mouth daily.      . PSYLLIUM PO Take 1 capsule by mouth daily.        Allergies: No Known Allergies  Social History:   reports that he quit smoking about 30 years ago. He does not have any smokeless tobacco history on file. He reports that he drinks alcohol.    PHYSICAL EXAM: VS:  BP 132/68  Pulse 56  Resp 18  Ht 5\' 9"  (1.753 m)  Wt 148 lb 6.4 oz (67.314 kg)  BMI 21.91 kg/m2  SpO2 98% Well nourished, well developed, in no acute distress HEENT: normal Neck: no JVD Cardiac:  normal S1, S2; RRR; no murmur Lungs:  clear to auscultation bilaterally, no wheezing, rhonchi or rales Abd: soft, nontender, no hepatomegaly Ext: no edema; right wrist without hematoma or bruit  Skin: warm and dry Neuro:  CNs 2-12 intact, no focal abnormalities noted  EKG:  Sinus bradycardia, HR 48, normal axis, no ischemic changes      ASSESSMENT AND PLAN:  1. Chest Pain:   Resolved. No further cardiac evaluation planned.  2. Coronary Artery Disease:   He had nonobstructive disease on his cardiac catheterization. Continued risk factor modification will be pursued. He will continue aspirin. He  will continue on atorvastatin. He is quite active. He does not smoke. Plan followup with Dr. Shirlee Latch in 6 months.  3. Hypertension:  Controlled.  Continue current therapy.  He can bring his new BP machine in to check with our RN.  Check BMET today.  4. Dyslipidemia:   Arrange L/L in 2 mos.    5. Bradycardia:   He is not symptomatic.  No hx of snoring.  No daytime hypersomnolence.  He is an avid runner.  Continue to monitor.   Luna Glasgow, PA-C  9:53 AM 02/08/2012

## 2012-02-08 NOTE — Telephone Encounter (Signed)
Message copied by Tarri Fuller on Fri Feb 08, 2012  2:43 PM ------      Message from: [place], [place] T      Created: Fri Feb 08, 2012  2:12 PM       Labs ok      Continue with current treatment plan.      Tereso Newcomer, PA-C  2:12 PM 02/08/2012

## 2012-02-22 ENCOUNTER — Ambulatory Visit (INDEPENDENT_AMBULATORY_CARE_PROVIDER_SITE_OTHER): Payer: BC Managed Care – PPO | Admitting: *Deleted

## 2012-02-22 VITALS — BP 142/95 | HR 52 | Wt 147.0 lb

## 2012-02-22 DIAGNOSIS — R002 Palpitations: Secondary | ICD-10-CM

## 2012-02-22 DIAGNOSIS — I10 Essential (primary) hypertension: Secondary | ICD-10-CM

## 2012-02-22 MED ORDER — PINDOLOL 5 MG PO TABS: ORAL_TABLET | ORAL | Status: DC

## 2012-02-22 MED ORDER — LISINOPRIL 10 MG PO TABS: 10.0000 mg | ORAL_TABLET | Freq: Every day | ORAL | Status: DC

## 2012-02-22 NOTE — Patient Instructions (Addendum)
Take Lisinopril in the morning every day.  Start Pindolol 2.5 mg by mouth daily at bedtime.  Wear a 24 hour holter monitor in 2 weeks if pindolol is helping PVC's.  If not call our office to cancel holter appt and discuss referral to EP physician.

## 2012-02-22 NOTE — Progress Notes (Signed)
Pt here for blood pressure check and to check home blood pressure cuff against office blood pressure readings.  Blood pressures as documented.  He is taking all meds on med list.  He takes lisinopril at bedtime.  Pt also reports he was started on atenolol in August due to PVC's.  He wore 24 hour monitor through his primary MD in August.  Atenolol was stopped during recent hospitalization due to low heart rate.  He reports he is now noticing an increase in PVC's.  These are intermittent and happen every 2-3 days.  They are waking him up at night and making him feel sluggish.  He is not drinking a lot of caffeine.  I reviewed all of the above with Darryl Ford ,PA.  Per Darryl Ford pt is to change lisinopril to AM daily and start pindolol 2.5 mg by mouth daily at bedtime.  He should wear 24 hour holter monitor in 2 weeks if pindolol is helping.  If not helping he should not wear monitor and call our office to discuss possible referral to EP.  I reviewed these instructions with pt and gave him written copy of instructions.  Darryl Ford will contact pt to schedule monitor appt.

## 2012-04-03 ENCOUNTER — Encounter: Payer: Self-pay | Admitting: Cardiology

## 2012-06-07 ENCOUNTER — Other Ambulatory Visit: Payer: Self-pay

## 2012-08-04 ENCOUNTER — Encounter: Payer: Self-pay | Admitting: *Deleted

## 2012-08-07 ENCOUNTER — Other Ambulatory Visit: Payer: Self-pay | Admitting: *Deleted

## 2012-08-07 ENCOUNTER — Encounter: Payer: Self-pay | Admitting: Cardiology

## 2012-08-07 ENCOUNTER — Ambulatory Visit (INDEPENDENT_AMBULATORY_CARE_PROVIDER_SITE_OTHER): Payer: BC Managed Care – PPO | Admitting: Cardiology

## 2012-08-07 ENCOUNTER — Telehealth: Payer: Self-pay | Admitting: Cardiology

## 2012-08-07 VITALS — BP 120/88 | HR 49 | Ht 69.0 in | Wt 152.0 lb

## 2012-08-07 DIAGNOSIS — E785 Hyperlipidemia, unspecified: Secondary | ICD-10-CM

## 2012-08-07 DIAGNOSIS — I2510 Atherosclerotic heart disease of native coronary artery without angina pectoris: Secondary | ICD-10-CM

## 2012-08-07 DIAGNOSIS — I10 Essential (primary) hypertension: Secondary | ICD-10-CM

## 2012-08-07 MED ORDER — LISINOPRIL 10 MG PO TABS: 10.0000 mg | ORAL_TABLET | Freq: Every day | ORAL | Status: DC

## 2012-08-07 NOTE — Patient Instructions (Addendum)
Your physician wants you to follow-up in: ONE YEAR WITH DR [facility] You will receive a reminder letter in the mail two months in advance. If you don't receive a letter, please call our office to schedule the follow-up appointment.   CALL ME TO DISCUSS MEDICINE

## 2012-08-07 NOTE — Telephone Encounter (Signed)
New problem ° ° °Pt returning your call. °

## 2012-08-07 NOTE — Telephone Encounter (Signed)
Spoke with pt, he is taking lisinopril 10 mg once daily. He is no longer taking pindolol. Will make dr Shirlee Latch aware and will call the pt back with any changes.

## 2012-08-08 NOTE — Progress Notes (Signed)
Patient ID: Darryl Ford, male   DOB: Jul 09, 1950, 62 y.o.   MRN: [id number]   Name:  Darryl Ford   DOB:  10/22/1950   MRN:  [id number]  PCP:  Darryl Otto, MD    Darryl Ford has a history of hypertension and PVCs. He was admitted 10/10-10/11/13 with chest pain. He was also noted to be bradycardic. His atenolol was discontinued. He ruled out for myocardial infarction by enzymes. Blood pressures were significantly elevated. Lisinopril was added. LHC 02/01/12 demonstrated nonobstructive disease with normal LV function. Risk factor modification was recommended. Atorvastatin and aspirin was added to his medical regimen.   Since 10/13, he has been doing well.  He jogs several miles 3-4 times a week with no exertional dyspnea or chest pain.  HR runs in the 40s-50s at rest.   No lightheadedness or syncope.  Labs (10/13): K 4.5, creatinine 0.95, LDL 94, Hgb 13.1 A1c 5.3, TSH 3.230 Labs (12/13): K 5.2, creatinine 0.91, LDL 70, HDL 85  PMH: 1. PVCs 2. HTN 3. Abdominal US 11/12 with no AAA. 4. CAD: LHC (10/13) with 30% pLAD, 60% ostial D1, EF 60-65%.  5. Sinus bradycardia   Current Outpatient Prescriptions  Medication Sig Dispense Refill  . aspirin EC 81 MG EC tablet Take 1 tablet (81 mg total) by mouth daily.      Darryl Ford Kitchen atorvastatin (LIPITOR) 10 MG tablet Take 1 tablet (10 mg total) by mouth at bedtime.  30 tablet  6  . Multiple Vitamin (MULTIVITAMIN WITH MINERALS) TABS Take 1 tablet by mouth daily.      . nitroGLYCERIN (NITROSTAT) 0.4 MG SL tablet Place 1 tablet (0.4 mg total) under the tongue every 5 (five) minutes x 3 doses as needed for chest pain.  25 tablet  4  . PSYLLIUM PO Take 1 capsule by mouth daily.      Darryl Ford Kitchen lisinopril (PRINIVIL,ZESTRIL) 10 MG tablet Take 1 tablet (10 mg total) by mouth daily.  90 tablet  3   No current facility-administered medications for this visit.    Allergies: No Known Allergies  Social History:   reports that he quit smoking about 30 years ago. He does not have  any smokeless tobacco history on file. He reports that  drinks alcohol.   Family History: No premature CAD.    PHYSICAL EXAM: VS:  BP 120/88  Pulse 49  Ht 5\' 9"  (1.753 m)  Wt 152 lb (68.947 kg)  BMI 22.44 kg/m2 Well nourished, well developed, in no acute distress HEENT: normal Neck: no JVD Cardiac:  normal S1, S2; RRR; no murmur Lungs:  clear to auscultation bilaterally, no wheezing, rhonchi or rales Abd: soft, nontender, no hepatomegaly Ext: no edema; right wrist without hematoma or bruit  Skin: warm and dry  EKG:  Sinus bradycardia, otherwise normal    ASSESSMENT AND PLAN: 1.  Coronary Artery Disease:   He had nonobstructive disease on his cardiac catheterization in 10/13. Continued risk factor modification will be pursued. He will continue aspirin. He will continue on atorvastatin. He is quite active. He does not smoke. Plan to followup in 1 year.  2. Hypertension:  Controlled on lisinopril. 3. Hyperlipidemia: Continue atorvastatin.  Good lipids in 12/13. 4. Bradycardia:   He is not symptomatic.  Likely due to high vagal tone in an athletic patient.   Darryl Ford 08/08/2012 3:44 PM

## 2012-10-05 ENCOUNTER — Other Ambulatory Visit: Payer: Self-pay | Admitting: Physician Assistant

## 2013-02-09 ENCOUNTER — Other Ambulatory Visit: Payer: Self-pay | Admitting: Cardiology

## 2013-02-17 IMAGING — US US AORTA
1 series · 9 of 9 positions shown · non-contrast
Comparison: 01/14/2007 CT

CLINICAL DATA: Smoker, evaluate for AAA

ABDOMINAL AORTA SCREENING ULTRASOUND
TECHNIQUE: Ultrasound examination of the abdominal aorta was
performed as a screening evaluation for abdominal aortic aneurysm.

[Series 1: us aorta · 0.40mm/px · 9 of 9 slices shown]
[im 1/9]
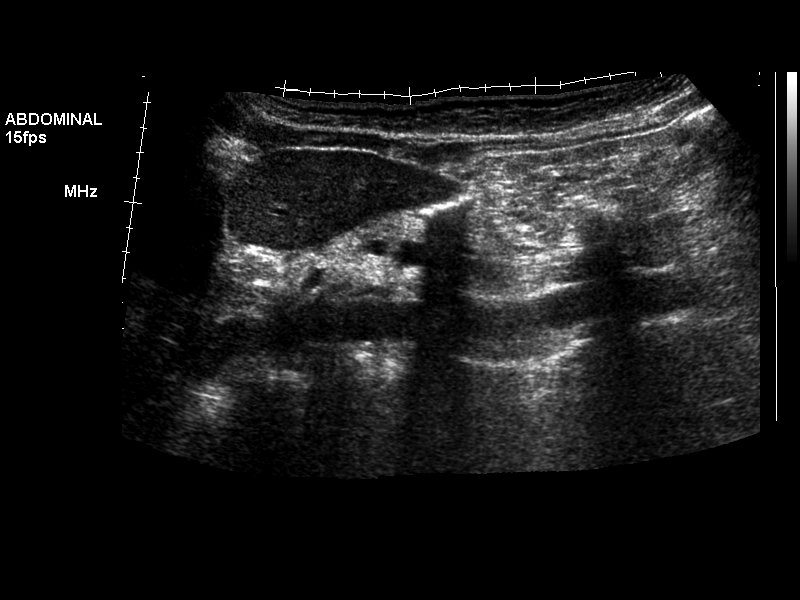
[im 2/9]
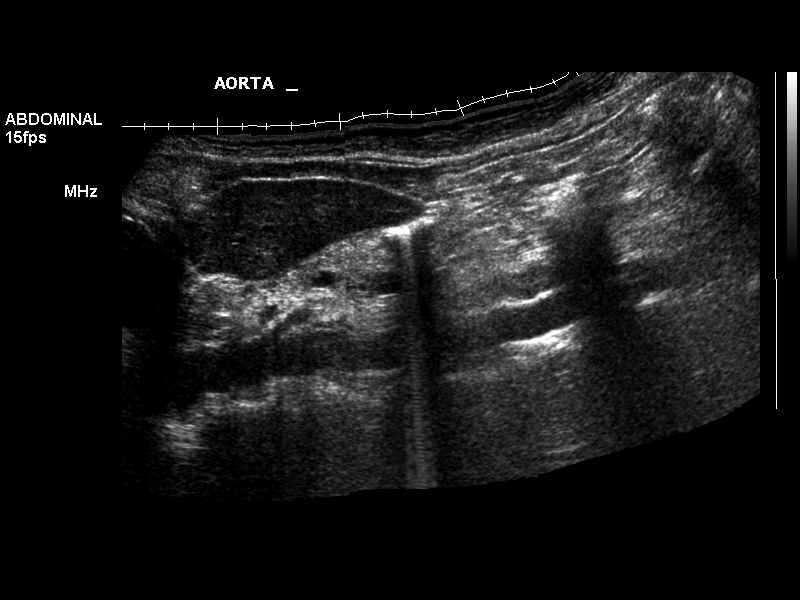
[im 3/9]
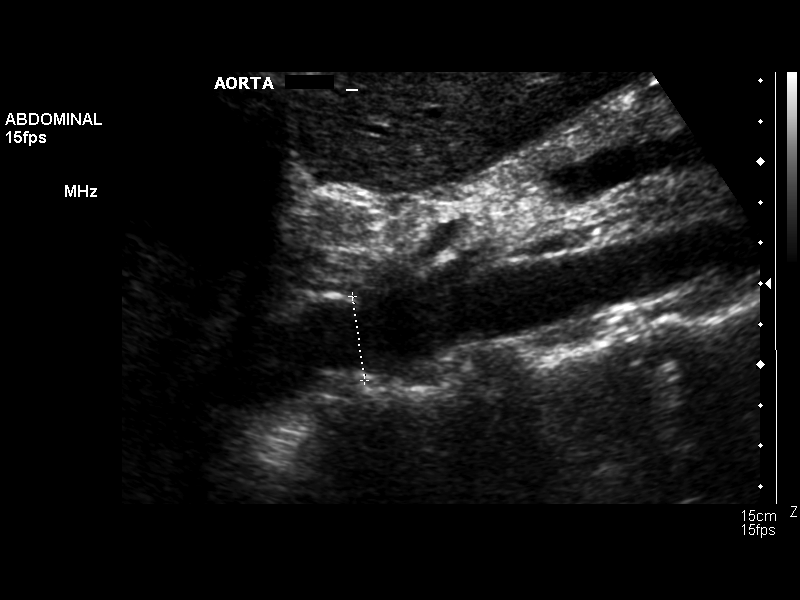
[im 4/9]
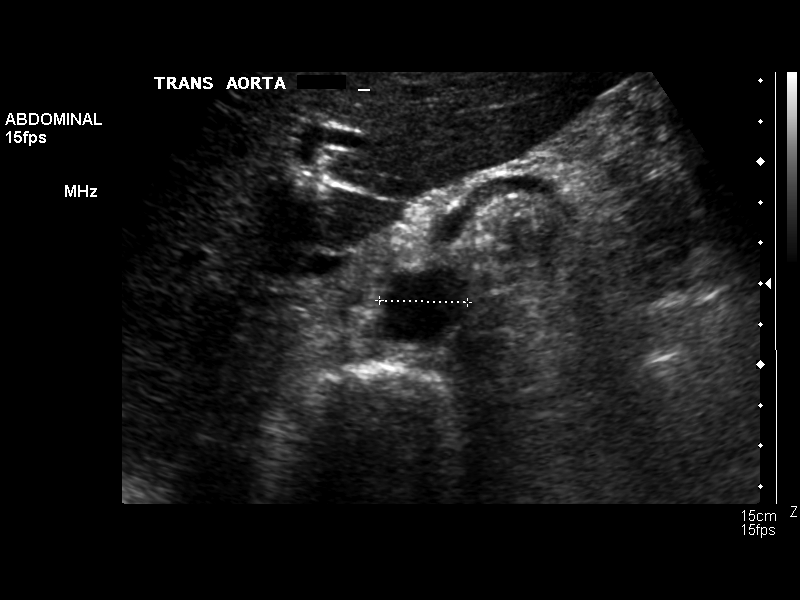
[im 5/9]
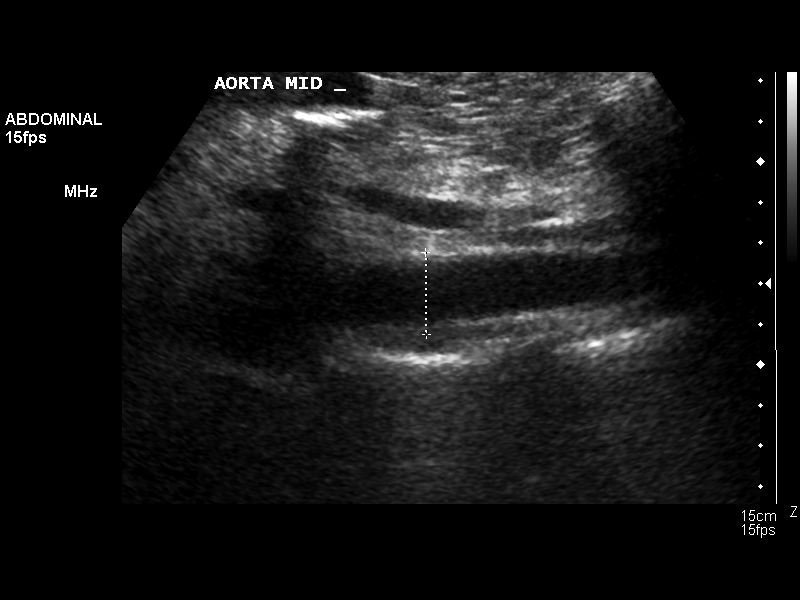
[im 6/9]
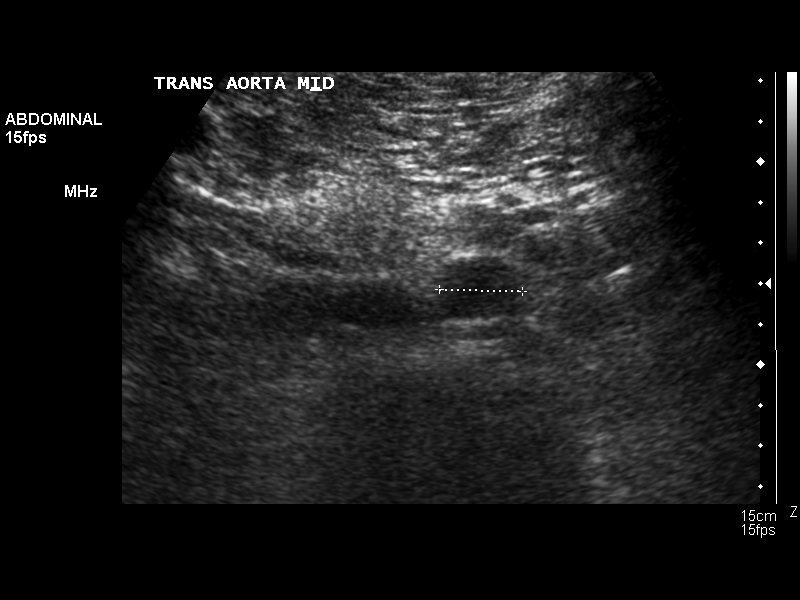
[im 7/9]
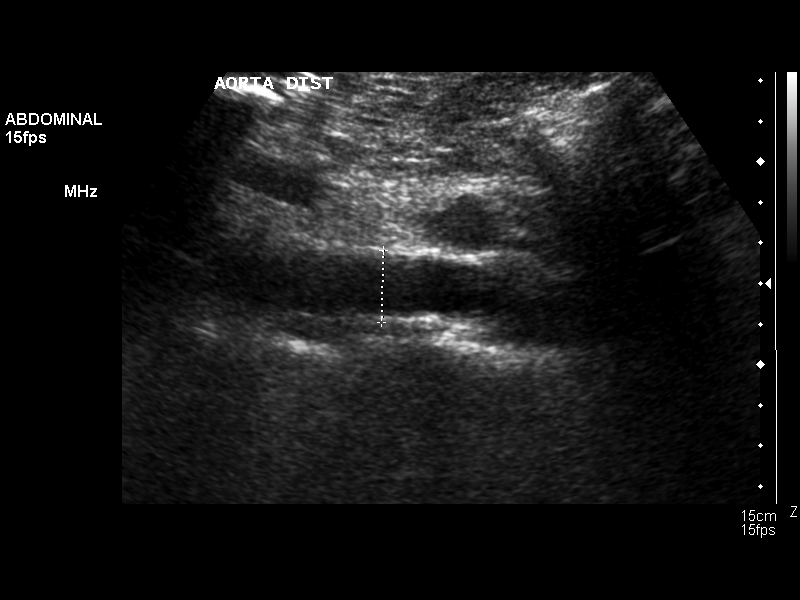
[im 8/9]
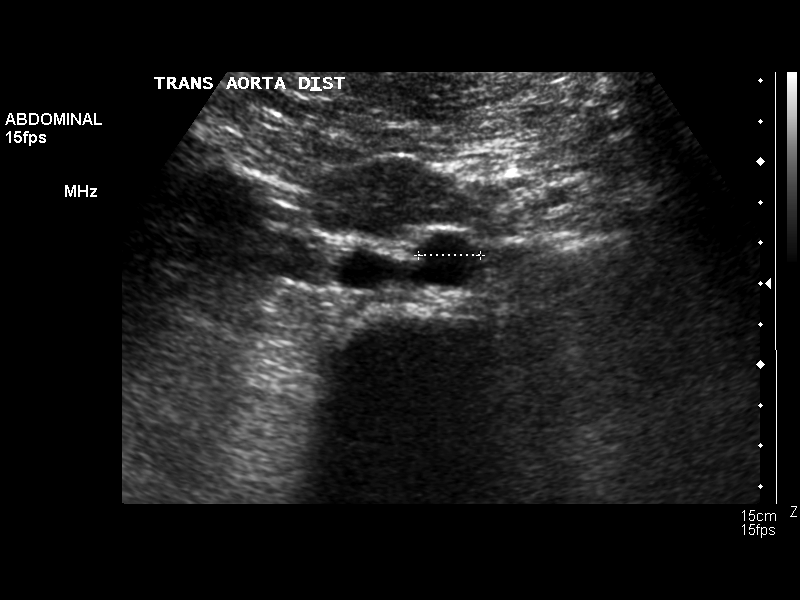
[im 9/9]
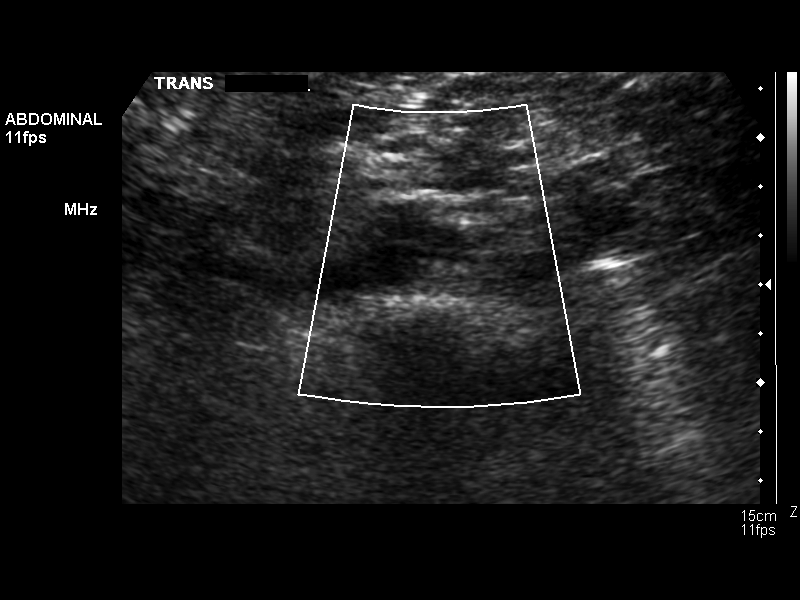

[9 of 9 positions shown; findings below may reference images not displayed]

Abdominal Aorta: No aneurysm identified.

      Maximum AP diameter:  2.1 cm.
      Maximum TRV diameter:  2.2 cm.
IMPRESSION: No abdominal aortic aneurysm identified.

## 2013-02-26 ENCOUNTER — Other Ambulatory Visit: Payer: Self-pay

## 2013-03-20 ENCOUNTER — Other Ambulatory Visit: Payer: Self-pay | Admitting: Cardiology

## 2013-05-29 ENCOUNTER — Other Ambulatory Visit: Payer: Self-pay | Admitting: Cardiology

## 2013-06-26 ENCOUNTER — Other Ambulatory Visit: Payer: Self-pay | Admitting: Cardiology

## 2013-07-11 ENCOUNTER — Encounter: Payer: Self-pay | Admitting: *Deleted

## 2013-09-07 ENCOUNTER — Encounter: Payer: Self-pay | Admitting: Cardiology

## 2013-09-07 ENCOUNTER — Other Ambulatory Visit: Payer: Self-pay | Admitting: Cardiology

## 2013-09-07 ENCOUNTER — Ambulatory Visit (INDEPENDENT_AMBULATORY_CARE_PROVIDER_SITE_OTHER): Payer: BC Managed Care – PPO | Admitting: Cardiology

## 2013-09-07 VITALS — BP 124/78 | HR 68 | Ht 69.0 in | Wt 150.0 lb

## 2013-09-07 DIAGNOSIS — E785 Hyperlipidemia, unspecified: Secondary | ICD-10-CM

## 2013-09-07 DIAGNOSIS — I10 Essential (primary) hypertension: Secondary | ICD-10-CM

## 2013-09-07 DIAGNOSIS — I2510 Atherosclerotic heart disease of native coronary artery without angina pectoris: Secondary | ICD-10-CM

## 2013-09-07 MED ORDER — NITROGLYCERIN 0.4 MG SL SUBL: 0.4000 mg | SUBLINGUAL_TABLET | SUBLINGUAL | Status: AC | PRN

## 2013-09-07 NOTE — Patient Instructions (Signed)
REFILLED YOUR NITROGLYCERIN AND SENT TO PHARMACY  Your physician wants you to [facility]: [facility]  You will receive a reminder letter in the mail two months in advance. If you don't receive a letter, please call our office to schedule the follow-up appointment.

## 2013-09-07 NOTE — Progress Notes (Signed)
Patient ID: Darryl Ford, male   DOB: 08/11/1950, 63 y.o.   MRN: [id number]   Name:  Darryl Ford   DOB:  02/12/1951   MRN:  [id number]  PCP:  Ginette OttoSTONEKING,HAL THOMAS, MD    Darryl Ford has a history of hypertension and PVCs. He was admitted 10/10-10/11/13 with chest pain. He was also noted to be bradycardic. His atenolol was discontinued. He ruled out for myocardial infarction by enzymes. Blood pressures were significantly elevated. Lisinopril was added. LHC 02/01/12 demonstrated nonobstructive disease with normal LV function. Risk factor modification was recommended. Atorvastatin and aspirin was added to his medical regimen.   He has been doing well since last visit.  He jogs several miles 3-4 times a week for 4-6 miles with no exertional dyspnea or chest pain.  HR higher today than in the past.   No lightheadedness or syncope.  Labs (10/13): K 4.5, creatinine 0.95, LDL 94, Hgb 13.1 A1c 5.3, TSH 3.230 Labs (12/13): K 5.2, creatinine 0.91, LDL 70, HDL 85  PMH: 1. PVCs 2. HTN 3. Abdominal US 11/12 with no AAA. 4. CAD: LHC (10/13) with 30% pLAD, 60% ostial D1, EF 60-65%.  5. Sinus bradycardia   Current Outpatient Prescriptions  Medication Sig Dispense Refill  . aspirin EC 81 MG EC tablet Take 1 tablet (81 mg total) by mouth daily.      Marland Kitchen. atorvastatin (LIPITOR) 10 MG tablet TAKE 1 TABLET BY MOUTH EVERY NIGHT AT BEDTIME  90 tablet  0  . lisinopril (PRINIVIL,ZESTRIL) 10 MG tablet TAKE 1 TABLET BY MOUTH EVERY DAY  90 tablet  0  . Multiple Vitamin (MULTIVITAMIN WITH MINERALS) TABS Take 1 tablet by mouth daily.      . nitroGLYCERIN (NITROSTAT) 0.4 MG SL tablet Place 1 tablet (0.4 mg total) under the tongue every 5 (five) minutes x 3 doses as needed for chest pain.  25 tablet  4  . PSYLLIUM PO Take 1 capsule by mouth daily.       No current facility-administered medications for this visit.    Allergies: No Known Allergies  Social History:   reports that he quit smoking about 31 years ago. He does  not have any smokeless tobacco history on file. He reports that he drinks alcohol.   Family History: No premature CAD.   PHYSICAL EXAM: VS:  BP 124/78  Pulse 68  Ht 5\' 9"  (1.753 m)  Wt 68.04 kg (150 lb)  BMI 22.14 kg/m2 Well nourished, well developed, in no acute distress HEENT: normal Neck: no JVD Cardiac:  normal S1, S2; RRR; no murmur Lungs:  clear to auscultation bilaterally, no wheezing, rhonchi or rales Abd: soft, nontender, no hepatomegaly Ext: no edema; right wrist without hematoma or bruit  Skin: warm and dry  ASSESSMENT AND PLAN: 1.  Coronary Artery Disease:   He had nonobstructive disease on his cardiac catheterization in 10/13. Continued risk factor modification will be pursued. He will continue aspirin. He will continue on atorvastatin. He is quite active. He does not smoke. Plan to followup in 1 year.  2. Hypertension:  Controlled on lisinopril. 3. Hyperlipidemia: Continue atorvastatin.  He will have labs done by PCP next week, I asked for a copy.  4. Bradycardia:   He is not symptomatic.  Likely due to high vagal tone in an athletic patient. HR is higher today than in the past.   Darryl Ford 09/07/2013 11:14 AM

## 2013-09-30 ENCOUNTER — Other Ambulatory Visit: Payer: Self-pay

## 2013-09-30 MED ORDER — LISINOPRIL 10 MG PO TABS: ORAL_TABLET | ORAL | Status: DC

## 2013-12-27 ENCOUNTER — Other Ambulatory Visit: Payer: Self-pay | Admitting: Cardiology

## 2014-01-07 IMAGING — CR DG CHEST 2V
2 series · 2 of 2 positions shown · non-contrast
Comparison: None.

CLINICAL DATA: Chest pressure, hypertension

CHEST - 2 VIEW

[w chest pa]
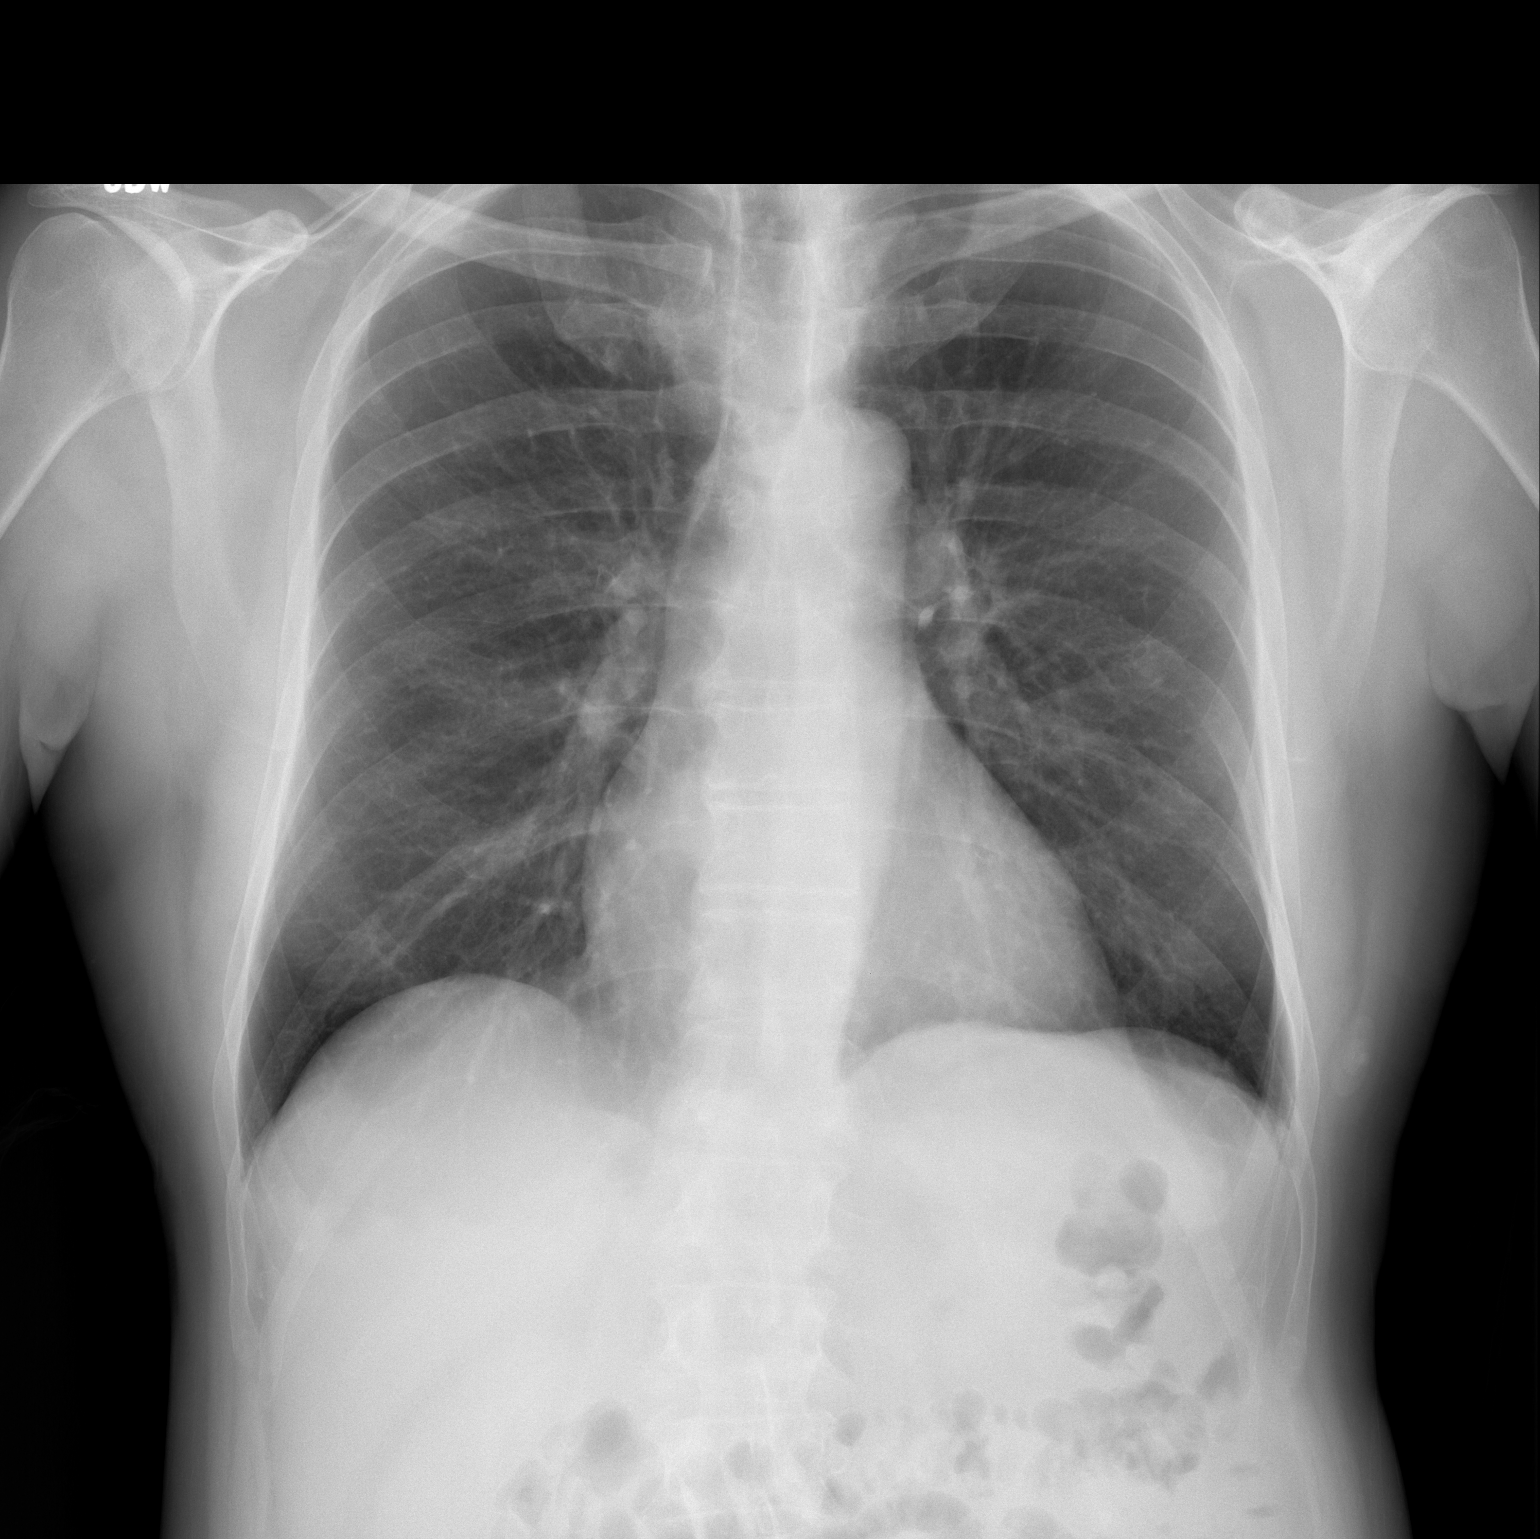

[w chest lat]
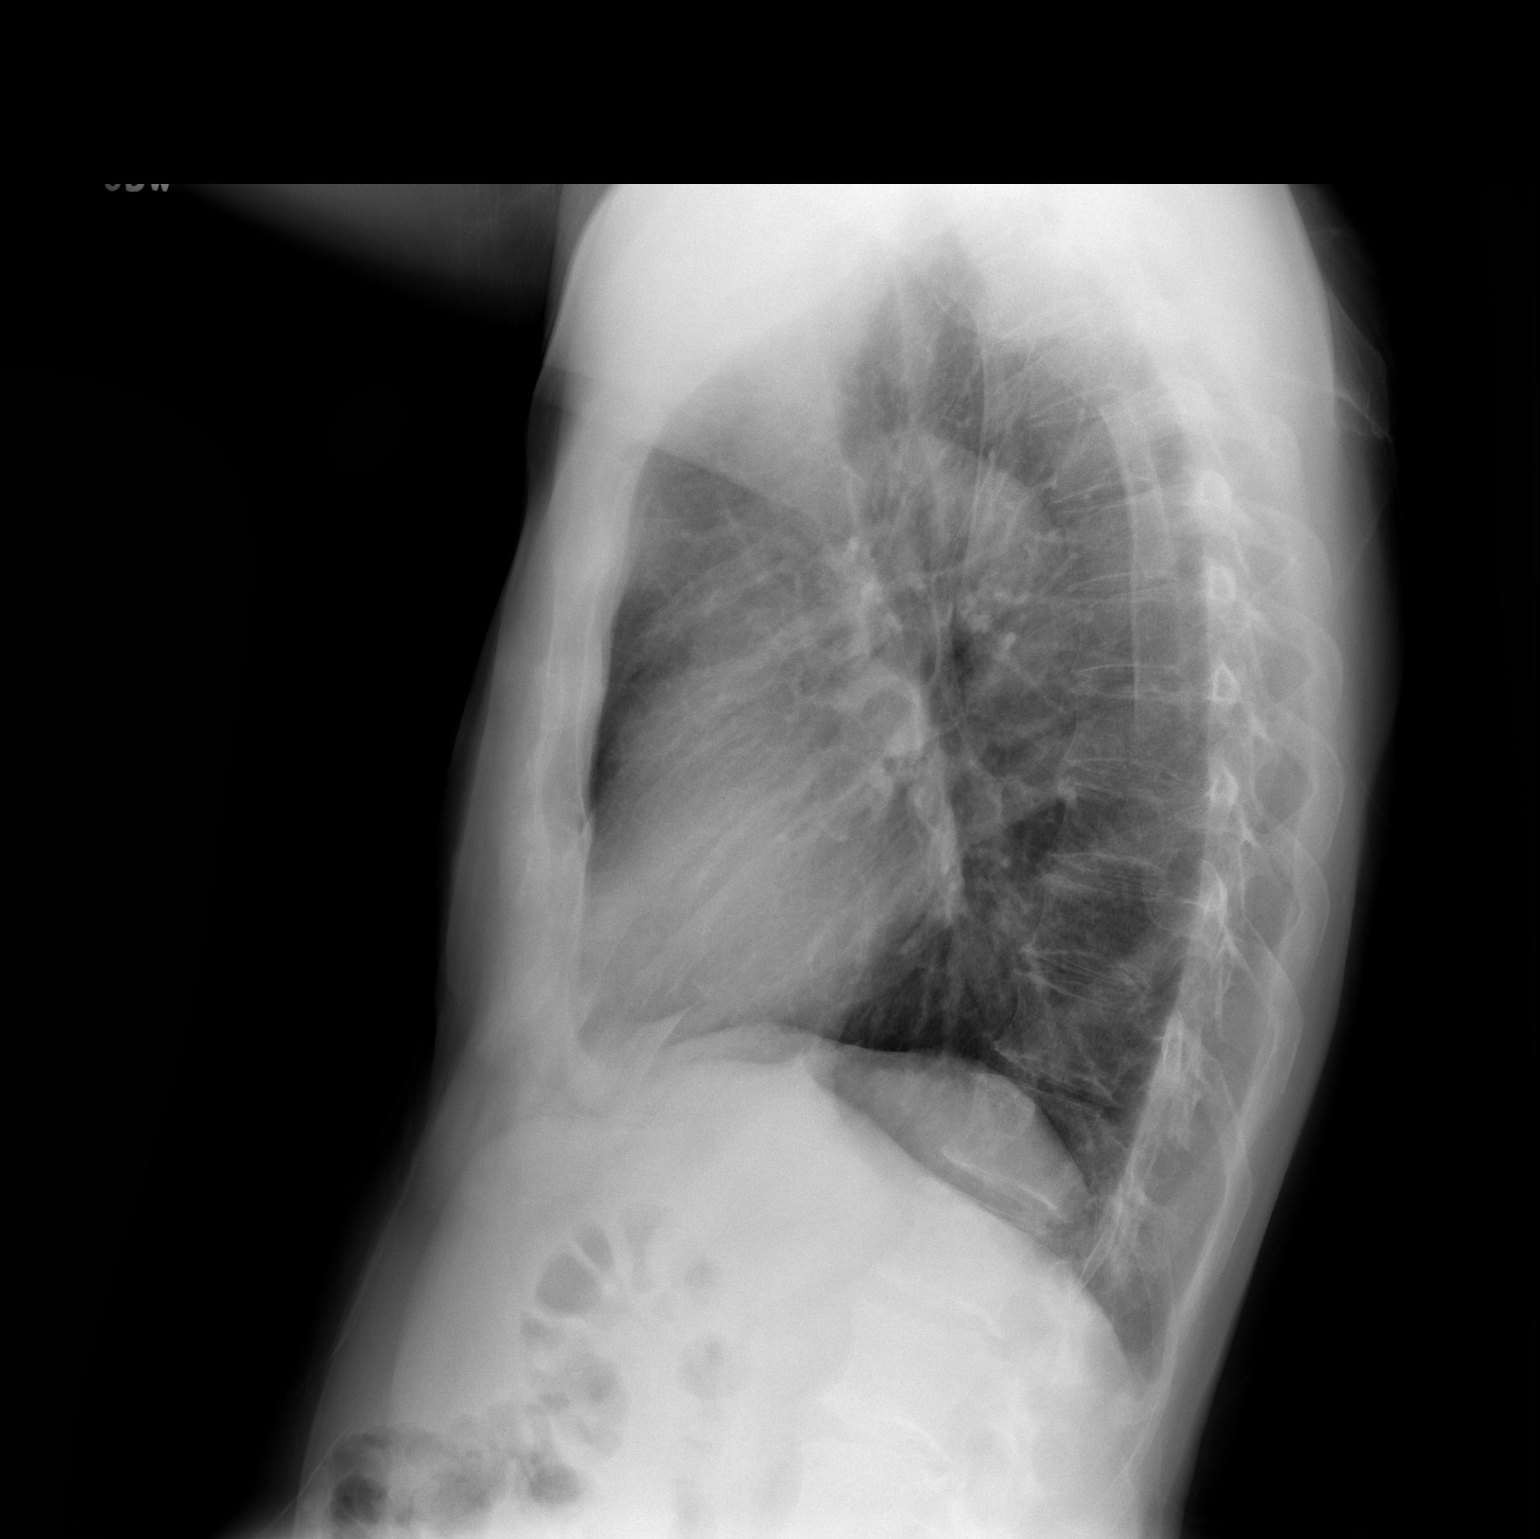

[2 of 2 positions shown; findings below may reference images not displayed]

FINDINGS: Cardiomediastinal silhouette is within normal limits. The
lungs are clear. No pleural effusion.  No pneumothorax.  No acute
osseous abnormality.
IMPRESSION: No acute cardiopulmonary process.

## 2014-01-21 DIAGNOSIS — Z23 Encounter for immunization: Secondary | ICD-10-CM | POA: Diagnosis not present

## 2014-03-09 ENCOUNTER — Other Ambulatory Visit: Payer: Self-pay | Admitting: Cardiology

## 2014-03-26 DIAGNOSIS — E780 Pure hypercholesterolemia: Secondary | ICD-10-CM | POA: Diagnosis not present

## 2014-03-26 DIAGNOSIS — I10 Essential (primary) hypertension: Secondary | ICD-10-CM | POA: Diagnosis not present

## 2014-03-26 DIAGNOSIS — Z23 Encounter for immunization: Secondary | ICD-10-CM | POA: Diagnosis not present

## 2014-03-26 DIAGNOSIS — Z79899 Other long term (current) drug therapy: Secondary | ICD-10-CM | POA: Diagnosis not present

## 2014-03-26 DIAGNOSIS — Z0000 Encounter for general adult medical examination without abnormal findings: Secondary | ICD-10-CM | POA: Diagnosis not present

## 2014-03-28 ENCOUNTER — Other Ambulatory Visit: Payer: Self-pay | Admitting: Cardiology

## 2014-03-29 DIAGNOSIS — E780 Pure hypercholesterolemia: Secondary | ICD-10-CM | POA: Diagnosis not present

## 2014-03-29 DIAGNOSIS — Z79899 Other long term (current) drug therapy: Secondary | ICD-10-CM | POA: Diagnosis not present

## 2014-03-29 DIAGNOSIS — I10 Essential (primary) hypertension: Secondary | ICD-10-CM | POA: Diagnosis not present

## 2014-04-01 ENCOUNTER — Encounter (HOSPITAL_COMMUNITY): Payer: Self-pay | Admitting: Cardiology

## 2014-09-01 ENCOUNTER — Encounter: Payer: Self-pay | Admitting: *Deleted

## 2014-09-08 ENCOUNTER — Ambulatory Visit: Payer: Self-pay | Admitting: Cardiology

## 2014-09-08 DIAGNOSIS — H15103 Unspecified episcleritis, bilateral: Secondary | ICD-10-CM | POA: Diagnosis not present

## 2014-09-14 ENCOUNTER — Other Ambulatory Visit: Payer: Self-pay | Admitting: Cardiology

## 2014-09-28 ENCOUNTER — Telehealth: Payer: Self-pay

## 2014-09-28 NOTE — Telephone Encounter (Signed)
SPOKE WITH PATIENT ABOUT FAMILY HISTORY AND STATUS.

## 2014-09-29 ENCOUNTER — Ambulatory Visit (INDEPENDENT_AMBULATORY_CARE_PROVIDER_SITE_OTHER): Payer: Medicare Other | Admitting: Physician Assistant

## 2014-09-29 ENCOUNTER — Encounter: Payer: Self-pay | Admitting: Physician Assistant

## 2014-09-29 VITALS — BP 119/60 | HR 56 | Ht 69.0 in | Wt 146.0 lb

## 2014-09-29 DIAGNOSIS — R001 Bradycardia, unspecified: Secondary | ICD-10-CM | POA: Diagnosis not present

## 2014-09-29 DIAGNOSIS — E785 Hyperlipidemia, unspecified: Secondary | ICD-10-CM

## 2014-09-29 DIAGNOSIS — I10 Essential (primary) hypertension: Secondary | ICD-10-CM | POA: Diagnosis not present

## 2014-09-29 DIAGNOSIS — I2510 Atherosclerotic heart disease of native coronary artery without angina pectoris: Secondary | ICD-10-CM | POA: Diagnosis not present

## 2014-09-29 NOTE — Patient Instructions (Signed)
Medication Instructions:  Your physician recommends that you continue on your current medications as directed. Please refer to the Current Medication list given to you today.   Labwork: We will request your recent labs from you primary care physician  Testing/Procedures: None   Follow-Up: Your physician wants you to follow-up in: 18-24 months You will receive a reminder letter in the mail two months in advance. If you don't receive a letter, please call our office to schedule the follow-up appointment.   Any Other Special Instructions Will Be Listed Below (If Applicable).

## 2014-09-29 NOTE — Progress Notes (Signed)
Cardiology Office Note   Date:  09/29/2014   ID:  Darryl CheshireJohn A Masoner, DOB 10/27/1950, MRN [id number]  PCP:  Ginette OttoSTONEKING,HAL THOMAS, MD  Cardiologist:  Dr. Marca Anconaalton McLean     Chief Complaint  Patient presents with  . Coronary Artery Disease     History of Present Illness: Darryl Ford is a 64 y.o. male with a hx of HTN, PVCs. He was admitted for chest pain in October 2013. He ruled out for myocardial infarction. LHC demonstrated nonobstructive CAD with normal LV function. Last seen by Dr. Sherlie BanMcClain 5/15. He returns for follow-up.  The patient denies chest pain, shortness of breath, syncope, orthopnea, PND or significant pedal edema. He runs 3-4 miles a day.  He removed his deck this AM from his house.     Studies/Reports Reviewed Today:  None   Past Medical History  Diagnosis Date  . PVC (premature ventricular contraction)   . Hypertension     Abdominal Ultrasound 11/12:   No AAA  . CAD (coronary artery disease)     a. Cath 02/01/12 - LM 20%, 30% prox LAD, 60% ostial D1, mild luminal irregularities of RCA, EF 60-65%.  . Sinus bradycardia     Past Surgical History  Procedure Laterality Date  . Left heart cath  02/01/2012  . Left heart catheterization with coronary angiogram N/A 02/01/2012    Procedure: LEFT HEART CATHETERIZATION WITH CORONARY ANGIOGRAM;  Surgeon: Laurey Moralealton S McLean, MD;  [facility];  Service: Cardiovascular;  Laterality: N/A;     Current Outpatient Prescriptions  Medication Sig Dispense Refill  . aspirin EC 81 MG EC tablet Take 1 tablet (81 mg total) by mouth daily.    Marland Kitchen. atorvastatin (LIPITOR) 10 MG tablet TAKE 1 TABLET BY MOUTH AT BEDTIME 90 tablet 3  . lisinopril (PRINIVIL,ZESTRIL) 10 MG tablet TAKE 1 TABLET BY MOUTH EVERY DAY 90 tablet 0  . Multiple Vitamin (MULTIVITAMIN WITH MINERALS) TABS Take 1 tablet by mouth daily.    . nitroGLYCERIN (NITROSTAT) 0.4 MG SL tablet Place 1 tablet (0.4 mg total) under the tongue every 5 (five) minutes x 3 doses as  needed for chest pain. 25 tablet 4  . PSYLLIUM PO Take 1 capsule by mouth daily.     No current facility-administered medications for this visit.    Allergies:   Review of patient's allergies indicates no known allergies.    Social History:  The patient  reports that he quit smoking about 32 years ago. He does not have any smokeless tobacco history on file. He reports that he drinks alcohol.   Family History:  The patient's family history includes Alzheimer's disease in his mother; Coronary artery disease in his father; Leukemia in his paternal grandfather; Stroke in his paternal grandmother. There is no history of Heart attack.    ROS:   Please see the history of present illness.   Review of Systems  All other systems reviewed and are negative.     PHYSICAL EXAM: VS:  BP 119/60 mmHg  Pulse 56  Ht 5\' 9"  (1.753 m)  Wt 146 lb (66.225 kg)  BMI 21.55 kg/m2    Wt Readings from Last 3 Encounters:  09/29/14 146 lb (66.225 kg)  09/07/13 150 lb (68.04 kg)  08/07/12 152 lb (68.947 kg)     GEN: Well nourished, well developed, in no acute distress HEENT: normal Neck: no JVD, no carotid bruits, no masses Cardiac:  Normal S1/S2, RRR; no murmur ,  no rubs or gallops,  no edema   Respiratory:  clear to auscultation bilaterally, no wheezing, rhonchi or rales. GI: soft, nontender, nondistended, + BS MS: no deformity or atrophy Skin: warm and dry  Neuro:  CNs II-XII intact, Strength and sensation are intact Psych: Normal affect   EKG:  EKG is ordered today.  It demonstrates:   Sinus brady, HR 56, normal axis, no change from prior tracing.    Recent Labs: No results found for requested labs within last 365 days.    Lipid Panel    Component Value Date/Time   CHOL 189 02/01/2012 0326   TRIG 101 02/01/2012 0326   HDL 75 02/01/2012 0326   CHOLHDL 2.5 02/01/2012 0326   VLDL 20 02/01/2012 0326   LDLCALC 94 02/01/2012 0326      ASSESSMENT AND PLAN:  1. Coronary Artery  Disease: He had nonobstructive disease on his cardiac catheterization in 10/13. Continued risk factor modification will be pursued. He will continue aspirin. He will continue on atorvastatin. He is quite active. He does not smoke. Plan to followup in 1.5 to 2 years.   2. Hypertension: Controlled on lisinopril.  Request most recent labs from PCP.  3. Hyperlipidemia: Continue atorvastatin. request most recent labs from PCP.   4. Bradycardia: He is not symptomatic. Likely due to high vagal tone in an athletic patient.     Current medicines are reviewed at length with the patient today.  Concerns regarding medicines are as outlined above.  The following changes have been made:    None    Labs/ tests ordered today include:     Orders Placed This Encounter  Procedures  . EKG 12-Lead    Disposition:   FU with Dr. Marca Ancona or me in 18-24 mos.    Signed, Brynda Rim, MHS 09/29/2014 5:20 PM    [facility] [address] [place], [place], [place]  [address] Phone: [phone]; Fax: [phone]

## 2014-10-01 DIAGNOSIS — E780 Pure hypercholesterolemia: Secondary | ICD-10-CM | POA: Diagnosis not present

## 2014-10-01 DIAGNOSIS — Z79899 Other long term (current) drug therapy: Secondary | ICD-10-CM | POA: Diagnosis not present

## 2014-10-01 DIAGNOSIS — I10 Essential (primary) hypertension: Secondary | ICD-10-CM | POA: Diagnosis not present

## 2014-10-01 NOTE — Progress Notes (Signed)
Labs 03/29/14: Hgb 15.2, BUN 9, creatinine 0.92, Na 140, K 5, ALT 34, TC 178, TG 83, HDL 77, LDL [address] PA-C   10/01/2014 9:03 AM

## 2014-10-06 DIAGNOSIS — Z79899 Other long term (current) drug therapy: Secondary | ICD-10-CM | POA: Diagnosis not present

## 2014-10-07 ENCOUNTER — Other Ambulatory Visit: Payer: Self-pay | Admitting: Cardiology

## 2014-10-14 ENCOUNTER — Ambulatory Visit: Payer: Self-pay | Admitting: Nurse Practitioner

## 2014-10-22 ENCOUNTER — Ambulatory Visit: Payer: Self-pay | Admitting: Nurse Practitioner

## 2015-02-24 DIAGNOSIS — Z23 Encounter for immunization: Secondary | ICD-10-CM | POA: Diagnosis not present

## 2015-03-28 DIAGNOSIS — E7800 Pure hypercholesterolemia, unspecified: Secondary | ICD-10-CM | POA: Diagnosis not present

## 2015-03-28 DIAGNOSIS — I10 Essential (primary) hypertension: Secondary | ICD-10-CM | POA: Diagnosis not present

## 2015-03-28 DIAGNOSIS — D7589 Other specified diseases of blood and blood-forming organs: Secondary | ICD-10-CM | POA: Diagnosis not present

## 2015-03-28 DIAGNOSIS — Z0000 Encounter for general adult medical examination without abnormal findings: Secondary | ICD-10-CM | POA: Diagnosis not present

## 2015-03-28 DIAGNOSIS — Z1389 Encounter for screening for other disorder: Secondary | ICD-10-CM | POA: Diagnosis not present

## 2015-03-28 DIAGNOSIS — Z79899 Other long term (current) drug therapy: Secondary | ICD-10-CM | POA: Diagnosis not present

## 2015-09-20 ENCOUNTER — Other Ambulatory Visit: Payer: Self-pay | Admitting: Cardiology

## 2015-09-20 MED ORDER — LISINOPRIL 10 MG PO TABS: 10.0000 mg | ORAL_TABLET | Freq: Every day | ORAL | Status: DC

## 2015-09-26 DIAGNOSIS — I10 Essential (primary) hypertension: Secondary | ICD-10-CM | POA: Diagnosis not present

## 2015-09-26 DIAGNOSIS — Z79899 Other long term (current) drug therapy: Secondary | ICD-10-CM | POA: Diagnosis not present

## 2016-02-01 ENCOUNTER — Encounter: Payer: Self-pay | Admitting: Physician Assistant

## 2016-02-13 DIAGNOSIS — Z23 Encounter for immunization: Secondary | ICD-10-CM | POA: Diagnosis not present

## 2016-03-23 ENCOUNTER — Encounter: Payer: Self-pay | Admitting: Physician Assistant

## 2016-03-23 ENCOUNTER — Ambulatory Visit: Payer: Medicare Other | Admitting: Physician Assistant

## 2016-03-23 ENCOUNTER — Ambulatory Visit (INDEPENDENT_AMBULATORY_CARE_PROVIDER_SITE_OTHER): Payer: Medicare Other | Admitting: Physician Assistant

## 2016-03-23 VITALS — BP 120/86 | HR 61 | Ht 70.0 in | Wt 151.4 lb

## 2016-03-23 DIAGNOSIS — I2510 Atherosclerotic heart disease of native coronary artery without angina pectoris: Secondary | ICD-10-CM

## 2016-03-23 DIAGNOSIS — I10 Essential (primary) hypertension: Secondary | ICD-10-CM

## 2016-03-23 DIAGNOSIS — E7800 Pure hypercholesterolemia, unspecified: Secondary | ICD-10-CM

## 2016-03-23 LAB — LIPID PANEL
CHOLESTEROL: 185 mg/dL (ref <200)
HDL: 99 mg/dL (ref >40)
LDL Cholesterol: 71 mg/dL (ref <100)
Total CHOL/HDL Ratio: 1.9 Ratio (ref <5.0)
Triglycerides: 76 mg/dL (ref <150)
VLDL: 15 mg/dL (ref <30)

## 2016-03-23 LAB — COMPREHENSIVE METABOLIC PANEL
ALT: 24 U/L (ref 9–46)
AST: 24 U/L (ref 10–35)
Albumin: 4.2 g/dL (ref 3.6–5.1)
Alkaline Phosphatase: 69 U/L (ref 40–115)
BILIRUBIN TOTAL: 1 mg/dL (ref 0.2–1.2)
BUN: 10 mg/dL (ref 7–25)
CO2: 25 mmol/L (ref 20–31)
CREATININE: 0.77 mg/dL (ref 0.70–1.25)
Calcium: 9.8 mg/dL (ref 8.6–10.3)
Chloride: 103 mmol/L (ref 98–110)
Glucose, Bld: 92 mg/dL (ref 65–99)
Potassium: 4.3 mmol/L (ref 3.5–5.3)
SODIUM: 139 mmol/L (ref 135–146)
Total Protein: 6.7 g/dL (ref 6.1–8.1)

## 2016-03-23 LAB — URINALYSIS
BILIRUBIN URINE: NEGATIVE
Glucose, UA: NEGATIVE
HGB URINE DIPSTICK: NEGATIVE
Leukocytes, UA: NEGATIVE
Nitrite: NEGATIVE
Protein, ur: NEGATIVE
SPECIFIC GRAVITY, URINE: 1.015 (ref 1.001–1.035)
pH: 6 (ref 5.0–8.0)

## 2016-03-23 NOTE — Patient Instructions (Addendum)
Medication Instructions:  Your physician recommends that you continue on your current medications as directed. Please refer to the Current Medication list given to you today.  Labwork: TODAY CMET, LIPID AND U/A   Testing/Procedures: NONE  Follow-Up: SCOTT WEAVER, PAC 18 MONTHS; WE WILL SEND OUT A REMINDER LETTER ASKING YOU TO CALL AND MAKE AN APPT.   Any Other Special Instructions Will Be Listed Below (If Applicable).  If you need a refill on your cardiac medications before your next appointment, please call your pharmacy.
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
## 2016-03-27 ENCOUNTER — Telehealth: Payer: Self-pay | Admitting: *Deleted

## 2016-03-27 NOTE — Telephone Encounter (Signed)
Pt notified of lab results by phone with verbal understanding . Pt states he also reviewed results on My Chart as well. Pt thanked me for the call.

## 2016-09-20 ENCOUNTER — Other Ambulatory Visit: Payer: Self-pay | Admitting: Cardiology

## 2017-01-24 DIAGNOSIS — Z23 Encounter for immunization: Secondary | ICD-10-CM | POA: Diagnosis not present

## 2017-02-28 DIAGNOSIS — I10 Essential (primary) hypertension: Secondary | ICD-10-CM | POA: Diagnosis not present

## 2017-02-28 DIAGNOSIS — Z125 Encounter for screening for malignant neoplasm of prostate: Secondary | ICD-10-CM | POA: Diagnosis not present

## 2017-02-28 DIAGNOSIS — E7800 Pure hypercholesterolemia, unspecified: Secondary | ICD-10-CM | POA: Diagnosis not present

## 2017-03-05 DIAGNOSIS — I10 Essential (primary) hypertension: Secondary | ICD-10-CM | POA: Diagnosis not present

## 2017-03-05 DIAGNOSIS — E7800 Pure hypercholesterolemia, unspecified: Secondary | ICD-10-CM | POA: Diagnosis not present

## 2017-03-05 DIAGNOSIS — Z125 Encounter for screening for malignant neoplasm of prostate: Secondary | ICD-10-CM | POA: Diagnosis not present

## 2017-03-08 DIAGNOSIS — E875 Hyperkalemia: Secondary | ICD-10-CM | POA: Diagnosis not present

## 2017-05-10 ENCOUNTER — Other Ambulatory Visit: Payer: Self-pay | Admitting: *Deleted

## 2017-05-10 MED ORDER — LISINOPRIL 10 MG PO TABS: ORAL_TABLET | ORAL | 1 refills | Status: AC

## 2017-05-10 MED ORDER — ATORVASTATIN CALCIUM 10 MG PO TABS: 10.0000 mg | ORAL_TABLET | Freq: Every day | ORAL | 1 refills | Status: AC

## 2018-01-07 DIAGNOSIS — L720 Epidermal cyst: Secondary | ICD-10-CM | POA: Diagnosis not present

## 2018-01-07 DIAGNOSIS — L570 Actinic keratosis: Secondary | ICD-10-CM | POA: Diagnosis not present

## 2018-01-07 DIAGNOSIS — L821 Other seborrheic keratosis: Secondary | ICD-10-CM | POA: Diagnosis not present

## 2018-01-29 DIAGNOSIS — Z1331 Encounter for screening for depression: Secondary | ICD-10-CM | POA: Diagnosis not present

## 2018-01-29 DIAGNOSIS — Z125 Encounter for screening for malignant neoplasm of prostate: Secondary | ICD-10-CM | POA: Diagnosis not present

## 2018-01-29 DIAGNOSIS — Z1211 Encounter for screening for malignant neoplasm of colon: Secondary | ICD-10-CM | POA: Diagnosis not present

## 2018-01-29 DIAGNOSIS — E782 Mixed hyperlipidemia: Secondary | ICD-10-CM | POA: Diagnosis not present

## 2018-01-29 DIAGNOSIS — Z23 Encounter for immunization: Secondary | ICD-10-CM | POA: Diagnosis not present

## 2018-01-29 DIAGNOSIS — H60331 Swimmer's ear, right ear: Secondary | ICD-10-CM | POA: Diagnosis not present

## 2018-01-29 DIAGNOSIS — Z0000 Encounter for general adult medical examination without abnormal findings: Secondary | ICD-10-CM | POA: Diagnosis not present

## 2018-01-29 DIAGNOSIS — I119 Hypertensive heart disease without heart failure: Secondary | ICD-10-CM | POA: Diagnosis not present

## 2018-01-29 DIAGNOSIS — Z6820 Body mass index (BMI) 20.0-20.9, adult: Secondary | ICD-10-CM | POA: Diagnosis not present

## 2018-01-29 DIAGNOSIS — Z1159 Encounter for screening for other viral diseases: Secondary | ICD-10-CM | POA: Diagnosis not present

## 2018-02-05 DIAGNOSIS — L723 Sebaceous cyst: Secondary | ICD-10-CM | POA: Diagnosis not present

## 2018-02-05 DIAGNOSIS — L7211 Pilar cyst: Secondary | ICD-10-CM | POA: Diagnosis not present

## 2018-03-11 DIAGNOSIS — E782 Mixed hyperlipidemia: Secondary | ICD-10-CM | POA: Diagnosis not present

## 2018-03-11 DIAGNOSIS — Z125 Encounter for screening for malignant neoplasm of prostate: Secondary | ICD-10-CM | POA: Diagnosis not present

## 2018-03-11 DIAGNOSIS — I119 Hypertensive heart disease without heart failure: Secondary | ICD-10-CM | POA: Diagnosis not present

## 2018-03-11 DIAGNOSIS — Z1159 Encounter for screening for other viral diseases: Secondary | ICD-10-CM | POA: Diagnosis not present
